# Patient Record
Sex: Female | Born: 1950
Health system: Southern US, Community
[De-identification: ages and names within clinical notes are randomized; demographics above are authoritative.]

## PROBLEM LIST (undated history)

## (undated) ENCOUNTER — Emergency Department (HOSPITAL_BASED_OUTPATIENT_CLINIC_OR_DEPARTMENT_OTHER): Discharge status: Absent without leave | Payer: BLUE CROSS/BLUE SHIELD

## (undated) DIAGNOSIS — E079 Disorder of thyroid, unspecified: Secondary | ICD-10-CM

## (undated) DIAGNOSIS — E7800 Pure hypercholesterolemia, unspecified: Secondary | ICD-10-CM

## (undated) DIAGNOSIS — E119 Type 2 diabetes mellitus without complications: Secondary | ICD-10-CM

## (undated) DIAGNOSIS — I10 Essential (primary) hypertension: Secondary | ICD-10-CM

## (undated) DIAGNOSIS — M109 Gout, unspecified: Secondary | ICD-10-CM

## (undated) HISTORY — PX: ABDOMINAL HYSTERECTOMY: SHX81

## (undated) HISTORY — PX: TONSILLECTOMY: SUR1361

---

## 2009-02-13 ENCOUNTER — Emergency Department (HOSPITAL_BASED_OUTPATIENT_CLINIC_OR_DEPARTMENT_OTHER): Admission: EM | Admit: 2009-02-13 | Discharge: 2009-02-13 | Payer: Self-pay | Admitting: Emergency Medicine

## 2009-02-13 ENCOUNTER — Ambulatory Visit: Payer: Self-pay | Admitting: Radiology

## 2009-02-15 ENCOUNTER — Emergency Department (HOSPITAL_BASED_OUTPATIENT_CLINIC_OR_DEPARTMENT_OTHER): Admission: EM | Admit: 2009-02-15 | Discharge: 2009-02-15 | Payer: Self-pay | Admitting: Emergency Medicine

## 2012-08-16 ENCOUNTER — Emergency Department (HOSPITAL_BASED_OUTPATIENT_CLINIC_OR_DEPARTMENT_OTHER)
Admission: EM | Admit: 2012-08-16 | Discharge: 2012-08-16 | Disposition: A | Discharge status: Home / Self Care | Payer: Managed Care, Other (non HMO) | Attending: Emergency Medicine | Admitting: Emergency Medicine

## 2012-08-16 ENCOUNTER — Encounter (HOSPITAL_BASED_OUTPATIENT_CLINIC_OR_DEPARTMENT_OTHER): Payer: Self-pay | Admitting: Family Medicine

## 2012-08-16 DIAGNOSIS — G8929 Other chronic pain: Secondary | ICD-10-CM | POA: Insufficient documentation

## 2012-08-16 DIAGNOSIS — R269 Unspecified abnormalities of gait and mobility: Secondary | ICD-10-CM | POA: Insufficient documentation

## 2012-08-16 DIAGNOSIS — M1990 Unspecified osteoarthritis, unspecified site: Secondary | ICD-10-CM

## 2012-08-16 DIAGNOSIS — M2550 Pain in unspecified joint: Secondary | ICD-10-CM | POA: Insufficient documentation

## 2012-08-16 DIAGNOSIS — M545 Low back pain, unspecified: Secondary | ICD-10-CM | POA: Insufficient documentation

## 2012-08-16 DIAGNOSIS — M79609 Pain in unspecified limb: Secondary | ICD-10-CM | POA: Insufficient documentation

## 2012-08-16 DIAGNOSIS — M549 Dorsalgia, unspecified: Secondary | ICD-10-CM

## 2012-08-16 DIAGNOSIS — M129 Arthropathy, unspecified: Secondary | ICD-10-CM | POA: Insufficient documentation

## 2012-08-16 DIAGNOSIS — R209 Unspecified disturbances of skin sensation: Secondary | ICD-10-CM | POA: Insufficient documentation

## 2012-08-16 HISTORY — DX: Gout, unspecified: M10.9

## 2012-08-16 HISTORY — DX: Essential (primary) hypertension: I10

## 2012-08-16 HISTORY — DX: Pure hypercholesterolemia, unspecified: E78.00

## 2012-08-16 HISTORY — DX: Type 2 diabetes mellitus without complications: E11.9

## 2012-08-16 HISTORY — DX: Disorder of thyroid, unspecified: E07.9

## 2012-08-16 LAB — CBC WITH DIFFERENTIAL/PLATELET
Basophils Absolute: 0 10*3/uL (ref 0.0–0.1)
Basophils Relative: 0 % (ref 0–1)
Eosinophils Relative: 1 % (ref 0–5)
Hemoglobin: 13.2 g/dL (ref 12.0–15.0)
Lymphs Abs: 1.8 10*3/uL (ref 0.7–4.0)
MCH: 29.8 pg (ref 26.0–34.0)
MCV: 87.6 fL (ref 78.0–100.0)
Monocytes Absolute: 0.6 10*3/uL (ref 0.1–1.0)
Monocytes Relative: 11 % (ref 3–12)
Neutrophils Relative %: 54 % (ref 43–77)
RBC: 4.43 MIL/uL (ref 3.87–5.11)
WBC: 5.2 10*3/uL (ref 4.0–10.5)

## 2012-08-16 LAB — BASIC METABOLIC PANEL
BUN: 16 mg/dL (ref 6–23)
Creatinine, Ser: 0.8 mg/dL (ref 0.50–1.10)
Glucose, Bld: 109 mg/dL — ABNORMAL HIGH (ref 70–99)
Potassium: 4 mEq/L (ref 3.5–5.1)
Sodium: 141 mEq/L (ref 135–145)

## 2012-08-16 LAB — TROPONIN I: Troponin I: <0.3 ng/mL (ref <0.30)

## 2012-08-16 MED ORDER — PROMETHAZINE HCL 25 MG PO TABS: 25.0000 mg | ORAL_TABLET | Freq: Four times a day (QID) | ORAL | Status: AC | PRN

## 2012-08-16 MED ORDER — PREDNISONE 20 MG PO TABS: ORAL_TABLET | ORAL | Status: AC

## 2012-08-16 MED ORDER — HYDROCODONE-ACETAMINOPHEN 5-325 MG PO TABS: 2.0000 | ORAL_TABLET | Freq: Four times a day (QID) | ORAL | Status: DC | PRN

## 2012-08-16 MED ORDER — ACETAMINOPHEN 500 MG PO TABS
1000.0000 mg | ORAL_TABLET | Freq: Once | ORAL | Status: AC
  Administered 2012-08-16: 1000 mg via ORAL
  Filled 2012-08-16: qty 2

## 2012-08-16 NOTE — ED Provider Notes (Signed)
Medical screening examination/treatment/procedure(s) were performed by non-physician practitioner and as supervising physician I was immediately available for consultation/collaboration.  Glynn Octave, MD 08/16/12 [phone]

## 2012-08-16 NOTE — ED Notes (Signed)
MD at bedside. 

## 2012-08-16 NOTE — ED Notes (Signed)
Pt c/o right low back pain shooting down right leg. Pt sts she has h/o same pain for years and had XR last year for same and told it was "arthritis".

## 2012-08-16 NOTE — ED Provider Notes (Signed)
History    CSN: [id number] Arrival date & time 08/16/12  4098  First MD Initiated Contact with Patient 08/16/12 [phone]     Chief Complaint  Patient presents with  . Back Pain   (Consider location/radiation/quality/duration/timing/severity/associated sxs/prior Treatment) HPI Comments: Patient is a 62 year old female with history of gout and arthritis who presents today with low back pain. She has had this pain chronically for years, worse in the past 3 weeks. It is a sharp pain that radiates up her back and down her right leg. Pain is worse with walking. She does not remember straining her back, but states she does a lot of housework. She has been told that this pain is arthritis, but she does not believe arthritis would last this long and feels as though she should get checked. She also notes right arm pain and numbness that began last night. It starts in her chest and back and radiates into her arm. She does not have a headache and has never had these symptoms before. No fevers, chills, nausea, vomiting, diaphoresis, bowel or bladder incontinence, hx of CA, IVDA.   The history is provided by the patient. No language interpreter was used.   No past medical history on file. No past surgical history on file. No family history on file. History  Substance Use Topics  . Smoking status: Not on file  . Smokeless tobacco: Not on file  . Alcohol Use: Not on file   OB History   No data available     Review of Systems  Constitutional: Negative for fever and chills.  Gastrointestinal: Negative for nausea, vomiting and abdominal pain.  Musculoskeletal: Positive for back pain, arthralgias and gait problem.  Neurological: Positive for numbness.  All other systems reviewed and are negative.    Allergies  Review of patient's allergies indicates not on file.  Home Medications  No current outpatient prescriptions on file. BP 153/96  Pulse 100  Temp(Src) 98.6 F (37 C) (Oral)  Resp 20   SpO2 97% Physical Exam  Nursing note and vitals reviewed. Constitutional: She is oriented to person, place, and time. She appears well-developed and well-nourished. No distress.  HENT:  Head: Normocephalic and atraumatic.  Right Ear: External ear normal.  Left Ear: External ear normal.  Nose: Nose normal.  Mouth/Throat: Oropharynx is clear and moist.  Eyes: Conjunctivae are normal.  Neck: Normal range of motion.  Cardiovascular: Normal rate, regular rhythm, normal heart sounds, intact distal pulses and normal pulses.   Pulmonary/Chest: Effort normal and breath sounds normal. No stridor. No respiratory distress. She has no wheezes. She has no rales.  Abdominal: Soft. She exhibits no distension.  Musculoskeletal: Normal range of motion.       Lumbar back: She exhibits tenderness. She exhibits no bony tenderness.       Back:  Neurological: She is alert and oriented to person, place, and time. She has normal strength. No sensory deficit. Gait (antalgic ) abnormal. Coordination normal.  Skin: Skin is warm and dry. She is not diaphoretic. No erythema.  Psychiatric: She has a normal mood and affect. Her behavior is normal.    ED Course  Procedures (including critical care time)   Date: 08/16/2012  Rate: 70  Rhythm: normal sinus rhythm  QRS Axis: normal  Intervals: PR prolonged  ST/T Wave abnormalities: normal  Conduction Disutrbances:first-degree A-V block   Narrative Interpretation:   Old EKG Reviewed: none available    Labs Reviewed  BASIC METABOLIC PANEL - Abnormal;  Notable for the following:    Glucose, Bld 109 (*)    Calcium 10.7 (*)    GFR calc non Af Amer 78 (*)    All other components within normal limits  TROPONIN I  CBC WITH DIFFERENTIAL   No results found. 1. Back pain   2. Arthritis     MDM  Patient with back pain.  No neurological deficits and normal neuro exam. Patient can walk but states is painful.  No loss of bowel or bladder control.  No concern for  cauda equina.  No fever, night sweats, weight loss, h/o cancer, IVDU.  RICE protocol and pain medicine indicated and discussed with patient. No concern for cardiac etiology of arm and back pain.       Mora Bellman, [name] 08/16/12 1051

## 2014-06-05 ENCOUNTER — Encounter (HOSPITAL_BASED_OUTPATIENT_CLINIC_OR_DEPARTMENT_OTHER): Payer: Self-pay

## 2014-06-05 ENCOUNTER — Emergency Department (HOSPITAL_BASED_OUTPATIENT_CLINIC_OR_DEPARTMENT_OTHER)
Admission: EM | Admit: 2014-06-05 | Discharge: 2014-06-05 | Disposition: A | Discharge status: Home / Self Care | Payer: Managed Care, Other (non HMO) | Attending: Emergency Medicine | Admitting: Emergency Medicine

## 2014-06-05 DIAGNOSIS — M79674 Pain in right toe(s): Secondary | ICD-10-CM | POA: Diagnosis present

## 2014-06-05 DIAGNOSIS — Z7952 Long term (current) use of systemic steroids: Secondary | ICD-10-CM | POA: Insufficient documentation

## 2014-06-05 DIAGNOSIS — Z79899 Other long term (current) drug therapy: Secondary | ICD-10-CM | POA: Diagnosis not present

## 2014-06-05 DIAGNOSIS — E780 Pure hypercholesterolemia: Secondary | ICD-10-CM | POA: Diagnosis not present

## 2014-06-05 DIAGNOSIS — M21611 Bunion of right foot: Secondary | ICD-10-CM

## 2014-06-05 DIAGNOSIS — M2011 Hallux valgus (acquired), right foot: Secondary | ICD-10-CM | POA: Diagnosis not present

## 2014-06-05 DIAGNOSIS — E079 Disorder of thyroid, unspecified: Secondary | ICD-10-CM | POA: Diagnosis not present

## 2014-06-05 DIAGNOSIS — E119 Type 2 diabetes mellitus without complications: Secondary | ICD-10-CM | POA: Insufficient documentation

## 2014-06-05 DIAGNOSIS — M109 Gout, unspecified: Secondary | ICD-10-CM | POA: Insufficient documentation

## 2014-06-05 DIAGNOSIS — M722 Plantar fascial fibromatosis: Secondary | ICD-10-CM | POA: Diagnosis not present

## 2014-06-05 DIAGNOSIS — I10 Essential (primary) hypertension: Secondary | ICD-10-CM | POA: Diagnosis not present

## 2014-06-05 DIAGNOSIS — M7662 Achilles tendinitis, left leg: Secondary | ICD-10-CM | POA: Diagnosis not present

## 2014-06-05 MED ORDER — IBUPROFEN 800 MG PO TABS: 800.0000 mg | ORAL_TABLET | Freq: Three times a day (TID) | ORAL | Status: AC | PRN

## 2014-06-05 MED ORDER — HYDROCODONE-ACETAMINOPHEN 5-325 MG PO TABS
1.0000 | ORAL_TABLET | ORAL | Status: AC | PRN
Start: 2014-06-05 — End: ?

## 2014-06-05 MED ORDER — IBUPROFEN 800 MG PO TABS
800.0000 mg | ORAL_TABLET | Freq: Once | ORAL | Status: AC
  Administered 2014-06-05: 800 mg via ORAL
  Filled 2014-06-05: qty 1

## 2014-06-05 MED ORDER — HYDROCODONE-ACETAMINOPHEN 5-325 MG PO TABS
1.0000 | ORAL_TABLET | Freq: Once | ORAL | Status: AC
  Administered 2014-06-05: 1 via ORAL
  Filled 2014-06-05: qty 1

## 2014-06-05 NOTE — ED Provider Notes (Signed)
TIME SEEN: 12:20 PM  CHIEF COMPLAINT: Right great toe pain, left heel pain  HPI: Pt is a 64 y.o. female with history of hypertension, diabetes, hyperlipidemia, hypothyroidism, gout who presents emergency department with complaints of right medial great toe pain for the past several months. No history of injury. No swelling, erythema or warmth. Also complaining of waking up with left medial heel pain and tenderness over the Achilles tendon. States pain was worse with trying to get out of bed and worse with dorsiflexion. No known injury. Also no erythema or warmth to this foot or swelling. No numbness, tingling or focal weakness. Patient denies any fever.  ROS: See HPI Constitutional: no fever  Eyes: no drainage  ENT: no runny nose   Cardiovascular:  no chest pain  Resp: no SOB  GI: no vomiting GU: no dysuria Integumentary: no rash  Allergy: no hives  Musculoskeletal: no leg swelling  Neurological: no slurred speech ROS otherwise negative  PAST MEDICAL HISTORY/PAST SURGICAL HISTORY:  Past Medical History  Diagnosis Date  . Hypertension   . Diabetes mellitus without complication   . Thyroid disease   . Gout   . High cholesterol     MEDICATIONS:  Prior to Admission medications   Medication Sig Start Date End Date Taking? Authorizing Provider  Atorvastatin Calcium (LIPITOR PO) Take by mouth.    Historical Provider, MD  colchicine (COLCRYS) 0.6 MG tablet Take 0.6 mg by mouth daily.    Historical Provider, MD  HYDROcodone-acetaminophen (NORCO/VICODIN) 5-325 MG per tablet Take 2 tablets by mouth every 6 (six) hours as needed for pain. 08/16/12   Cleatrice Burke, [name]  levothyroxine (SYNTHROID, LEVOTHROID) 50 MCG tablet Take 50 mcg by mouth daily before breakfast.    Historical Provider, MD  lisinopril-hydrochlorothiazide (PRINZIDE,ZESTORETIC) 20-25 MG per tablet Take 1 tablet by mouth daily.    Historical Provider, MD  metFORMIN (GLUCOPHAGE) 500 MG tablet Take 500 mg by mouth daily with  breakfast.    Historical Provider, MD  predniSONE (DELTASONE) 20 MG tablet 3 tabs po day one, then 2 po daily x 4 days 08/16/12   Cleatrice Burke, [name]  promethazine (PHENERGAN) 25 MG tablet Take 1 tablet (25 mg total) by mouth every 6 (six) hours as needed for nausea. 08/16/12   Cleatrice Burke, [name]  SIMVASTATIN PO Take by mouth.    Historical Provider, MD    ALLERGIES:  No Known Allergies  SOCIAL HISTORY:  History  Substance Use Topics  . Smoking status: Never Smoker   . Smokeless tobacco: Not on file  . Alcohol Use: No    FAMILY HISTORY: No family history on file.  EXAM: BP 140/74 mmHg  Pulse 75  Temp(Src) 97.6 F (36.4 C) (Oral)  Resp 16  Ht 5' (1.524 m)  Wt 185 lb (83.915 kg)  BMI 36.13 kg/m2  SpO2 100% CONSTITUTIONAL: Alert and oriented and responds appropriately to questions. Well-appearing; well-nourished HEAD: Normocephalic EYES: Conjunctivae clear, PERRL ENT: normal nose; no rhinorrhea; moist mucous membranes; pharynx without lesions noted NECK: Supple, no meningismus, no LAD  CARD: RRR; S1 and S2 appreciated; no murmurs, no clicks, no rubs, no gallops RESP: Normal chest excursion without splinting or tachypnea; breath sounds clear and equal bilaterally; no wheezes, no rhonchi, no rales, no hypoxia or respiratory distress, speaking full sentences ABD/GI: Normal bowel sounds; non-distended; soft, non-tender, no rebound, no guarding, no peritoneal signs BACK:  The back appears normal and is non-tender to palpation, there is no CVA tenderness EXT: Patient is tender to  palpation over the medial aspect of the right great toe and has a bony prominence consistent with bunion, she is also tender to palpation over the medial aspect of the plantar surface of the left foot where the plantar fascia inserts without ecchymosis or warmth or erythema, tender palpation over the Achilles tendon on the left side without associated erythema or warmth, pain with dorsiflexion but Normal ROM  in all joints; otherwise extremities are non-tender to palpation; no edema; normal capillary refill; no cyanosis, no calf tenderness or swelling, normal sensation diffusely, 2+ DP pulses bilaterally    SKIN: Normal color for age and race; warm NEURO: Moves all extremities equally, sensation to light touch intact diffusely, cranial nerves II through XII intact PSYCH: The patient's mood and manner are appropriate. Grooming and personal hygiene are appropriate.  MEDICAL DECISION MAKING: Patient here with complaints of pain in her right foot and left foot. Right foot has a hallux valgus deformity. Left foot pain likely caused by plantar fasciitis and Achilles tendinitis. We'll provide her with crutches. No sign of gout or septic arthritis or cellulitis on exam. No history of any injury to suggest that x-rays are indicated. We'll discharge with ibuprofen and Vicodin. Have instructed her to follow-up with a podiatrist for her bunion and sports medicine as needed for her plantar fasciitis and Achilles tendinitis symptoms do not improve. Discussed return precautions. She verbalized understanding and is comfortable with plan.      [place], DO 06/05/14 1333

## 2014-06-05 NOTE — ED Notes (Signed)
Pt reports pain to left foot/ankle since yesterday. Also sts pain to right great toe. Denies injury

## 2014-06-05 NOTE — Discharge Instructions (Signed)
Achilles Tendinitis Achilles tendinitis is inflammation of the tough, cord-like band that attaches the lower muscles of your leg to your heel (Achilles tendon). It is usually caused by overusing the tendon and joint involved.  CAUSES Achilles tendinitis can happen because of:  A sudden increase in exercise or activity (such as running).  Doing the same exercises or activities (such as jumping) over and over.  Not warming up calf muscles before exercising.  Exercising in shoes that are worn out or not made for exercise.  Having arthritis or a bone growth on the back of the heel bone. This can rub against the tendon and hurt the tendon. SIGNS AND SYMPTOMS The most common symptoms are:  Pain in the back of the leg, just above the heel. The pain usually gets worse with exercise and better with rest.  Stiffness or soreness in the back of the leg, especially in the morning.  Swelling of the skin over the Achilles tendon.  Trouble standing on tiptoe. Sometimes, an Achilles tendon tears (ruptures). Symptoms of an Achilles tendon rupture can include:  Sudden, severe pain in the back of the leg.  Trouble putting weight on the foot or walking normally. DIAGNOSIS Achilles tendinitis will be diagnosed based on symptoms and a physical examination. An X-ray may be done to check if another condition is causing your symptoms. An MRI may be ordered if your health care provider suspects you may have completely torn your tendon, which is called an Achilles tendon rupture.  TREATMENT  Achilles tendinitis usually gets better over time. It can take weeks to months to heal completely. Treatment focuses on treating the symptoms and helping the injury heal. HOME CARE INSTRUCTIONS   Rest your Achilles tendon and avoid activities that cause pain.  Apply ice to the injured area:  Put ice in a plastic bag.  Place a towel between your skin and the bag.  Leave the ice on for 20 minutes, 2-3 times a  day  Try to avoid using the tendon (other than gentle range of motion) while the tendon is painful. Do not resume use until instructed by your health care provider. Then begin use gradually. Do not increase use to the point of pain. If pain does develop, decrease use and continue the above measures. Gradually increase activities that do not cause discomfort until you achieve normal use.  Do exercises to make your calf muscles stronger and more flexible. Your health care provider or physical therapist can recommend exercises for you to do.  Wrap your ankle with an elastic bandage or other wrap. This can help keep your tendon from moving too much. Your health care provider will show you how to wrap your ankle correctly.  Only take over-the-counter or prescription medicines for pain, discomfort, or fever as directed by your health care provider. SEEK MEDICAL CARE IF:   Your pain and swelling increase or pain is uncontrolled with medicines.  You develop new, unexplained symptoms or your symptoms get worse.  You are unable to move your toes or foot.  You develop warmth and swelling in your foot.  You have an unexplained temperature. MAKE SURE YOU:   Understand these instructions. Bunion You have a bunion deformity of the feet. This is more common in women. It tends to be an inherited problem. Symptoms can include pain, swelling, and deformity around the great toe. Numbness and tingling may also be present. Your symptoms are often worsened by wearing shoes that cause pressure on the bunion. Changing  the type of shoes you wear helps reduce symptoms. A wide shoe decreases pressure on the bunion. An arch support may be used if you have flat feet. Avoid shoes with heels higher than two inches. This puts more pressure on the bunion. X-rays may be helpful in evaluating the severity of the problem. Other foot problems often seen with bunions include corns, calluses, and hammer toes. If the deformity or  pain is severe, surgical treatment may be necessary. Keep off your painful foot as much as possible until the pain is relieved. Call your caregiver if your symptoms are worse.  SEEK IMMEDIATE MEDICAL CARE IF:  You have increased redness, pain, swelling, or other symptoms of infection. Document Released: 01/13/2005 Document Revised: 04/07/2011 Document Reviewed: 07/13/2006 [facility] Patient Information 2015 [place], [place]. This information is not intended to replace advice given to you by your health care provider. Make sure you discuss any questions you have with your health care provider.  Tendinitis Tendinitis is swelling and inflammation of the tendons. Tendons are band-like tissues that connect muscle to bone. Tendinitis commonly occurs in the:  Shoulders (rotator cuff). Heels (Achilles tendon). Elbows (triceps tendon). CAUSES Tendinitis is usually caused by overusing the tendon, muscles, and joints involved. When the tissue surrounding a tendon (synovium) becomes inflamed, it is called tenosynovitis. Tendinitis commonly develops in people whose jobs require repetitive motions. SYMPTOMS Pain. Tenderness. Mild swelling. DIAGNOSIS Tendinitis is usually diagnosed by physical exam. Your health care provider may also order X-rays or other imaging tests. TREATMENT Your health care provider may recommend certain medicines or exercises for your treatment. HOME CARE INSTRUCTIONS  Use a sling or splint for as long as directed by your health care provider until the pain decreases. Put ice on the injured area. Put ice in a plastic bag. Place a towel between your skin and the bag. Leave the ice on for 15-20 minutes, 3-4 times a day, or as directed by your health care provider. Avoid using the limb while the tendon is painful. Perform gentle range of motion exercises only as directed by your health care provider. Stop exercises if pain or discomfort increase, unless directed otherwise by your health  care provider. Only take over-the-counter or prescription medicines for pain, discomfort, or fever as directed by your health care provider. SEEK MEDICAL CARE IF:  Your pain and swelling increase. You develop new, unexplained symptoms, especially increased numbness in the hands. MAKE SURE YOU:  Understand these instructions. Will watch your condition. Will get help right away if you are not doing well or get worse. Document Released: 01/11/2000 Document Revised: 05/30/2013 Document Reviewed: 04/01/2010 [facility] Patient Information 2015 [place], [place]. This information is not intended to replace advice given to you by your health care provider. Make sure you discuss any questions you have with your health care provider.   Will watch your condition.  Will get help right away if you are not doing well or get worse. Document Released: 10/23/2004 Document Revised: 11/03/2012 Document Reviewed: 08/25/2012 [facility] Patient Information 2015 [place], [place]. This information is not intended to replace advice given to you by your health care provider. Make sure you discuss any questions you have with your health care provider.

## 2014-06-07 ENCOUNTER — Ambulatory Visit (INDEPENDENT_AMBULATORY_CARE_PROVIDER_SITE_OTHER): Payer: Managed Care, Other (non HMO) | Admitting: Podiatry

## 2014-06-07 ENCOUNTER — Encounter: Payer: Self-pay | Admitting: Podiatry

## 2014-06-07 VITALS — BP 123/73 | HR 82 | Ht 61.0 in | Wt 185.0 lb

## 2014-06-07 DIAGNOSIS — S93692A Other sprain of left foot, initial encounter: Secondary | ICD-10-CM

## 2014-06-07 DIAGNOSIS — R262 Difficulty in walking, not elsewhere classified: Secondary | ICD-10-CM | POA: Insufficient documentation

## 2014-06-07 DIAGNOSIS — M79605 Pain in left leg: Secondary | ICD-10-CM

## 2014-06-07 DIAGNOSIS — M669 Spontaneous rupture of unspecified tendon: Secondary | ICD-10-CM | POA: Insufficient documentation

## 2014-06-07 DIAGNOSIS — S96812A Strain of other specified muscles and tendons at ankle and foot level, left foot, initial encounter: Secondary | ICD-10-CM

## 2014-06-07 DIAGNOSIS — M79606 Pain in leg, unspecified: Secondary | ICD-10-CM | POA: Insufficient documentation

## 2014-06-07 NOTE — Progress Notes (Signed)
Subjective: 64 year old presented via wheel chair stating that she has had degenerative disc disease and been getting cortisone injections. Last injection was in December 2015. Total received 5 injections. Last month right great toe joint hurt, which happens occasionally as a form of gout. Then last Sunday (06/04/14) left foot start hurting in back of ankle and could not walk by that afternoon. She went to ER and was told that she had Achilles tendonitis. She is diabetic and blood sugar reading was up high yesterday, in 190's. Diagnosed for diabetic 2 years ago.  Been taking Ibuprofen and Hydrocodone. She is not able to take any step with the affected left foot.  Cholesterol, Thyroid, diabetes, hypertension, back pain x many years. Review of Systems - General ROS: negative Ophthalmic ROS: negative Allergy and Immunology ROS: negative Hematological and Lymphatic ROS: negative Endocrine ROS: Been treated for Thyroid conditions. Breast ROS: negative for breast lumps Respiratory ROS: no cough, shortness of breath, or wheezing Cardiovascular ROS: Been treated for Hypertension. Gastrointestinal ROS: Diabetic. Genito-Urinary ROS: no dysuria, trouble voiding, or hematuria Musculoskeletal ROS: DJD on lower back for many years. Neurological ROS: Lower back pain. Dermatological ROS: negative.  Objective: Neurovascular status are within normal. Mild edema medial and lateral aspect of the Achilles tendon insertion site with severe pain upon ankle joint range of motion left foot. No erythema or increase in temperature noted. Orthopedic: Weak and unable to perform active dorsiflexion of the ankle joint left.  No change in Achilles tendon structural integrity with palpation. No indentation or irregularity noted along the posterior, medial, or lateral borders of achilles tendon itself.  Positive of forefoot varus with bunion bilateral. Unable to bear weight or ambulate using left lower limb. Radiographic  examination show no acute changes in osseous structure on the back of ankle area left foot.  Assessment: Possible Plantaris tendon rupture left ankle. Pain and swelling posterior ankle, medial and lateral to achilles tendon. Difficulty walking using left lower limb.  Plan: Reviewed findings and available options. Left lower limb placed in Pneumatic CAM walker. Advised to stay off of feet for the next 2 weeks. Continue medication as prescribed at ER.  Return in one week.

## 2014-06-07 NOTE — Patient Instructions (Signed)
Seen for pain in left ankle pain. Possible "Ruptured Plantaris Tendon" left lower limb. Placed in [employer]. If pain continues, will replaced it with Cast. Do none weight bearing on left lower limb for the next 2 weeks.  Should be ok using a walker.  Return in one week for follow up.

## 2014-06-14 ENCOUNTER — Encounter: Payer: Self-pay | Admitting: Podiatry

## 2014-06-14 ENCOUNTER — Ambulatory Visit (INDEPENDENT_AMBULATORY_CARE_PROVIDER_SITE_OTHER): Payer: Managed Care, Other (non HMO) | Admitting: Podiatry

## 2014-06-14 VITALS — BP 170/80 | HR 77

## 2014-06-14 DIAGNOSIS — S93692D Other sprain of left foot, subsequent encounter: Secondary | ICD-10-CM

## 2014-06-14 DIAGNOSIS — M79672 Pain in left foot: Secondary | ICD-10-CM | POA: Diagnosis not present

## 2014-06-14 DIAGNOSIS — M10072 Idiopathic gout, left ankle and foot: Secondary | ICD-10-CM | POA: Diagnosis not present

## 2014-06-14 DIAGNOSIS — R609 Edema, unspecified: Secondary | ICD-10-CM | POA: Diagnosis not present

## 2014-06-14 DIAGNOSIS — R600 Localized edema: Secondary | ICD-10-CM

## 2014-06-14 DIAGNOSIS — S96812D Strain of other specified muscles and tendons at ankle and foot level, left foot, subsequent encounter: Secondary | ICD-10-CM

## 2014-06-14 DIAGNOSIS — M109 Gout, unspecified: Secondary | ICD-10-CM | POA: Insufficient documentation

## 2014-06-14 NOTE — Patient Instructions (Signed)
Follow up on injured Plantaris tendon left. Doing well with CAM walker. Developed acute gout on left. Cortisone injection given. Return in 3 weeks.

## 2014-06-14 NOTE — Progress Notes (Signed)
Subjective: 1 week follow up on Plantaris tendon rupture left.  Stated that she is having severe pain and swelling on the left foot big joint area for a few days. She is able to get around ok with minimum discomfort on ankle while walking in pneumatic CAM walker, but her forefoot is swollen and painful. Stated that she does have a history of gout. She stopped taking Colchicine while she was on Ibuprofen.   Objective: Red swollen first MPJ with severe pain when pressed or moved the joint. Minimum edema at the back of heel.   Assessment: 2 weeks post Tendon injury, Plantaris left. Acute Gout left big joint with pain and swelling.  Plan: Reviewed findings. Unna boot. Cortisone injection given to the first MPJ left. Injection consisted of a mixture of 4 mg Dexamethasone, 4 mg Triamcinolone, and 1 cc of 0.5% Marcaine plain.  Patient tolerated well without difficulty.  Patient is to go back to her Colchicine and leave off Ibuprofen.  Plan to be out of work for 6-8 week. Return in 3 weeks to evaluate if she would be ready to return to work.

## 2014-07-12 ENCOUNTER — Ambulatory Visit (INDEPENDENT_AMBULATORY_CARE_PROVIDER_SITE_OTHER): Payer: Managed Care, Other (non HMO) | Admitting: Podiatry

## 2014-07-12 ENCOUNTER — Encounter: Payer: Self-pay | Admitting: Podiatry

## 2014-07-12 VITALS — BP 152/78 | HR 76

## 2014-07-12 DIAGNOSIS — R262 Difficulty in walking, not elsewhere classified: Secondary | ICD-10-CM | POA: Diagnosis not present

## 2014-07-12 DIAGNOSIS — M669 Spontaneous rupture of unspecified tendon: Secondary | ICD-10-CM

## 2014-07-12 NOTE — Patient Instructions (Signed)
Seen for follow up on left foot tendon injury. Doing well with injection, wrapping and CAM walker.  Left lower limb placed in [place] and CAM walker.  Tentatively return to work date is set for August 07, 2014.  Return on July 6 to finalize release.

## 2014-07-12 NOTE — Progress Notes (Signed)
Subjective: 5 weeks follow up on Plantaris tendon rupture left.  Soft cast and CAM walker is helping. Took 800 mg Motrin that is helping her gout pain.   Objective: Resolved first MPJ pain after injection. Minimum edema at the back of heel.  Able to ambulate ok with Pneumatic CAM walker.   Assessment: 5 weeks post Tendon injury, Plantaris left. Resolved acute gout left big joint.  Plan: Reviewed findings. Unna boot applied. Return in 3 weeks to evaluate if she would be ready to return to work.

## 2014-08-02 ENCOUNTER — Ambulatory Visit: Payer: Managed Care, Other (non HMO) | Admitting: Podiatry

## 2014-12-20 ENCOUNTER — Encounter (HOSPITAL_BASED_OUTPATIENT_CLINIC_OR_DEPARTMENT_OTHER): Payer: Self-pay

## 2014-12-20 ENCOUNTER — Emergency Department (HOSPITAL_BASED_OUTPATIENT_CLINIC_OR_DEPARTMENT_OTHER): Payer: 59

## 2014-12-20 ENCOUNTER — Emergency Department (HOSPITAL_BASED_OUTPATIENT_CLINIC_OR_DEPARTMENT_OTHER)
Admission: EM | Admit: 2014-12-20 | Discharge: 2014-12-20 | Disposition: A | Discharge status: Home / Self Care | Payer: 59 | Attending: Physician Assistant | Admitting: Physician Assistant

## 2014-12-20 DIAGNOSIS — Z79899 Other long term (current) drug therapy: Secondary | ICD-10-CM | POA: Diagnosis not present

## 2014-12-20 DIAGNOSIS — M109 Gout, unspecified: Secondary | ICD-10-CM | POA: Diagnosis not present

## 2014-12-20 DIAGNOSIS — R109 Unspecified abdominal pain: Secondary | ICD-10-CM | POA: Diagnosis not present

## 2014-12-20 DIAGNOSIS — M549 Dorsalgia, unspecified: Secondary | ICD-10-CM | POA: Diagnosis not present

## 2014-12-20 DIAGNOSIS — Z90710 Acquired absence of both cervix and uterus: Secondary | ICD-10-CM | POA: Insufficient documentation

## 2014-12-20 DIAGNOSIS — E119 Type 2 diabetes mellitus without complications: Secondary | ICD-10-CM | POA: Diagnosis not present

## 2014-12-20 DIAGNOSIS — Z87440 Personal history of urinary (tract) infections: Secondary | ICD-10-CM | POA: Diagnosis not present

## 2014-12-20 DIAGNOSIS — Z7984 Long term (current) use of oral hypoglycemic drugs: Secondary | ICD-10-CM | POA: Diagnosis not present

## 2014-12-20 DIAGNOSIS — E7800 Pure hypercholesterolemia, unspecified: Secondary | ICD-10-CM | POA: Diagnosis not present

## 2014-12-20 DIAGNOSIS — G8929 Other chronic pain: Secondary | ICD-10-CM | POA: Diagnosis not present

## 2014-12-20 DIAGNOSIS — I10 Essential (primary) hypertension: Secondary | ICD-10-CM | POA: Insufficient documentation

## 2014-12-20 DIAGNOSIS — E079 Disorder of thyroid, unspecified: Secondary | ICD-10-CM | POA: Diagnosis not present

## 2014-12-20 DIAGNOSIS — Z7952 Long term (current) use of systemic steroids: Secondary | ICD-10-CM | POA: Diagnosis not present

## 2014-12-20 DIAGNOSIS — Z792 Long term (current) use of antibiotics: Secondary | ICD-10-CM | POA: Insufficient documentation

## 2014-12-20 DIAGNOSIS — R319 Hematuria, unspecified: Secondary | ICD-10-CM | POA: Diagnosis present

## 2014-12-20 LAB — URINALYSIS, ROUTINE W REFLEX MICROSCOPIC
BILIRUBIN URINE: NEGATIVE
GLUCOSE, UA: NEGATIVE mg/dL
KETONES UR: NEGATIVE mg/dL
Leukocytes, UA: NEGATIVE
Nitrite: NEGATIVE
PH: 6 (ref 5.0–8.0)
Protein, ur: 100 mg/dL — AB
SPECIFIC GRAVITY, URINE: 1.018 (ref 1.005–1.030)

## 2014-12-20 LAB — URINE MICROSCOPIC-ADD ON

## 2014-12-20 MED ORDER — CEPHALEXIN 500 MG PO CAPS: 500.0000 mg | ORAL_CAPSULE | Freq: Two times a day (BID) | ORAL | Status: AC

## 2014-12-20 MED ORDER — CEPHALEXIN 250 MG PO CAPS
500.0000 mg | ORAL_CAPSULE | Freq: Once | ORAL | Status: AC
  Administered 2014-12-20: 500 mg via ORAL
  Filled 2014-12-20: qty 2

## 2014-12-20 NOTE — ED Notes (Signed)
Reports blood in urine that started on Friday.  Reports right lower back pain.  Also complains of pressure in pubic region.

## 2014-12-20 NOTE — Discharge Instructions (Signed)
Please take antibiotics as prescribed. Additionally you will need to follow up with your primary care physician or urologist to make sure that the hematuria (blood in your urine) has gone away.

## 2014-12-20 NOTE — ED Provider Notes (Signed)
CSN: [id number]     Arrival date & time 12/20/14  1200 History   First MD Initiated Contact with Patient 12/20/14 1218     Chief Complaint  Patient presents with  . Hematuria     (Consider location/radiation/quality/duration/timing/severity/associated sxs/prior Treatment) HPI   Patient is a very pleasant 64 year old female with history of hypertension diabetes and high cholesterol presenting today with blood in her urine. Patient noted she had blood in her urine for the last couple days. She reports she has some pain in her right back as well. Has not had any fevers. No dysuria. No history of kidney stones.  Past Medical History  Diagnosis Date  . Hypertension   . Diabetes mellitus without complication ([place])   . Thyroid disease   . Gout   . High cholesterol    Past Surgical History  Procedure Laterality Date  . Tonsillectomy    . Abdominal hysterectomy     No family history on file. Social History  Substance Use Topics  . Smoking status: Never Smoker   . Smokeless tobacco: Never Used  . Alcohol Use: No   OB History    No data available     Review of Systems  Constitutional: Negative for activity change.  Respiratory: Negative for shortness of breath.   Cardiovascular: Negative for chest pain.  Gastrointestinal: Negative for abdominal pain.  Genitourinary: Positive for hematuria and flank pain. Negative for dysuria, vaginal bleeding and pelvic pain.      Allergies  Review of patient's allergies indicates no known allergies.  Home Medications   Prior to Admission medications   Medication Sig Start Date End Date Taking? Authorizing Provider  atorvastatin (LIPITOR) 40 MG tablet  03/14/14  Yes Historical Provider, MD  colchicine (COLCRYS) 0.6 MG tablet Take 0.6 mg by mouth daily.   Yes Historical Provider, MD  levothyroxine (SYNTHROID, LEVOTHROID) 50 MCG tablet Take 50 mcg by mouth daily before breakfast.   Yes Historical Provider, MD   lisinopril-hydrochlorothiazide (PRINZIDE,ZESTORETIC) 20-25 MG per tablet Take 1 tablet by mouth daily.   Yes Historical Provider, MD  metFORMIN (GLUCOPHAGE) 500 MG tablet Take 500 mg by mouth daily with breakfast.   Yes Historical Provider, MD  SIMVASTATIN PO Take by mouth.   Yes Historical Provider, MD  azithromycin (ZITHROMAX) 250 MG tablet  03/15/14   Historical Provider, MD  HYDROcodone-acetaminophen (NORCO/VICODIN) 5-325 MG per tablet Take 1 tablet by mouth every 4 (four) hours as needed. 06/05/14   Kristen N [name], DO  ibuprofen (ADVIL,MOTRIN) 800 MG tablet Take 1 tablet (800 mg total) by mouth every 8 (eight) hours as needed for mild pain. 06/05/14   Kristen N [name], DO  predniSONE (DELTASONE) 20 MG tablet 3 tabs po day one, then 2 po daily x 4 days 08/16/12   Cleatrice Burke, [name]  promethazine (PHENERGAN) 25 MG tablet Take 1 tablet (25 mg total) by mouth every 6 (six) hours as needed for nausea. 08/16/12   Cleatrice Burke, [name]   BP 159/75 mmHg  Pulse 97  Temp(Src) 97.6 F (36.4 C) (Oral)  Resp 20  Ht 5' (1.524 m)  Wt 190 lb (86.183 kg)  BMI 37.11 kg/m2  SpO2 99% Physical Exam  Constitutional: She is oriented to person, place, and time. She appears well-developed and well-nourished.  HENT:  Head: Normocephalic and atraumatic.  Eyes: Conjunctivae are normal. Right eye exhibits no discharge.  Neck: Neck supple.  Cardiovascular: Normal rate, regular rhythm and normal heart sounds.   No murmur heard. Pulmonary/Chest: Effort  normal and breath sounds normal. She has no wheezes. She has no rales.  Abdominal: Soft. She exhibits no distension. There is no tenderness.  Musculoskeletal: Normal range of motion. She exhibits no edema.  Neurological: She is oriented to person, place, and time. No cranial nerve deficit.  Skin: Skin is warm and dry. No rash noted. She is not diaphoretic.  Psychiatric: She has a normal mood and affect. Her behavior is normal.  Nursing note and vitals reviewed.   ED  Course  Procedures (including critical care time) Labs Review Labs Reviewed  URINALYSIS, ROUTINE W REFLEX MICROSCOPIC (NOT AT [facility]) - Abnormal; Notable for the following:    APPearance CLOUDY (*)    Hgb urine dipstick LARGE (*)    Protein, ur 100 (*)    All other components within normal limits  URINE MICROSCOPIC-ADD ON - Abnormal; Notable for the following:    Squamous Epithelial / LPF 0-5 (*)    Bacteria, UA FEW (*)    All other components within normal limits    Imaging Review Ct Renal Stone Study  12/20/2014  CLINICAL DATA:  Right-sided flank pain and hematuria for 5 days EXAM: CT ABDOMEN AND PELVIS WITHOUT CONTRAST TECHNIQUE: Multidetector CT imaging of the abdomen and pelvis was performed following the standard protocol without IV contrast. COMPARISON:  07/06/2012 FINDINGS: Lung bases are free of acute infiltrate or sizable effusion. The liver, gallbladder, spleen, adrenal glands and pancreas are all normal in their CT appearance. Kidneys are well visualized and reveal no evidence of renal calculi or obstructive change. The ureters are within normal limits. The bladder is partially distended. No pelvic mass lesion is seen. The uterus has been surgically removed.1 degenerative changes of the lumbar spine are noted. The appendix is not well visualized although no inflammatory changes to suggest appendicitis are seen. IMPRESSION: No evidence of renal calculi or urinary tract obstructive changes. No acute abnormality seen. Electronically Signed   By: Inez Catalina M.D.   On: 12/20/2014 13:09   I have personally reviewed and evaluated these images and lab results as part of my medical decision-making.   EKG Interpretation None      MDM   Final diagnoses:  Right flank pain    Patient is a very pleasant to 64 year old female presenting with hypertension diabetes and high cholesterol presenting with hematuria. Patient has painless hematuria. She does report she has some pain in the  right-side of back. However she has chronic pain in this area. Today we're concerned about a stone versus UTI. Her urine shows hemoglobin her urine however no evidence of infection. She does however have dysuria. She was treated for a urinary tract infection with hematuria in 2012. Her CT scan shows no evidence of stone.  We will treat for urinary tract infection today given her past infection with hematuria. We will give her follow-up for urology however and told her to follow-up next week regardless to make sure that her hematuria has passed.  Courteney Julio Alm, MD 12/20/14 1436

## 2015-08-24 ENCOUNTER — Other Ambulatory Visit: Payer: Self-pay | Admitting: Internal Medicine

## 2015-08-24 DIAGNOSIS — R945 Abnormal results of liver function studies: Secondary | ICD-10-CM

## 2015-08-24 DIAGNOSIS — R7989 Other specified abnormal findings of blood chemistry: Secondary | ICD-10-CM

## 2015-09-04 ENCOUNTER — Ambulatory Visit
Admission: RE | Admit: 2015-09-04 | Discharge: 2015-09-04 | Disposition: A | Discharge status: Home / Self Care | Payer: BLUE CROSS/BLUE SHIELD | Source: Ambulatory Visit | Attending: Internal Medicine | Admitting: Internal Medicine

## 2015-09-04 ENCOUNTER — Other Ambulatory Visit: Payer: Self-pay | Admitting: Internal Medicine

## 2015-09-04 DIAGNOSIS — R945 Abnormal results of liver function studies: Secondary | ICD-10-CM

## 2015-09-04 DIAGNOSIS — R7989 Other specified abnormal findings of blood chemistry: Secondary | ICD-10-CM

## 2016-04-27 ENCOUNTER — Emergency Department (HOSPITAL_BASED_OUTPATIENT_CLINIC_OR_DEPARTMENT_OTHER): Payer: PPO

## 2016-04-27 ENCOUNTER — Emergency Department (HOSPITAL_BASED_OUTPATIENT_CLINIC_OR_DEPARTMENT_OTHER)
Admission: EM | Admit: 2016-04-27 | Discharge: 2016-04-27 | Disposition: A | Discharge status: Home / Self Care | Payer: PPO | Attending: Emergency Medicine | Admitting: Emergency Medicine

## 2016-04-27 ENCOUNTER — Encounter (HOSPITAL_BASED_OUTPATIENT_CLINIC_OR_DEPARTMENT_OTHER): Payer: Self-pay | Admitting: Emergency Medicine

## 2016-04-27 DIAGNOSIS — J069 Acute upper respiratory infection, unspecified: Secondary | ICD-10-CM

## 2016-04-27 DIAGNOSIS — I10 Essential (primary) hypertension: Secondary | ICD-10-CM | POA: Diagnosis not present

## 2016-04-27 DIAGNOSIS — Z7984 Long term (current) use of oral hypoglycemic drugs: Secondary | ICD-10-CM | POA: Insufficient documentation

## 2016-04-27 DIAGNOSIS — E119 Type 2 diabetes mellitus without complications: Secondary | ICD-10-CM | POA: Insufficient documentation

## 2016-04-27 DIAGNOSIS — B9789 Other viral agents as the cause of diseases classified elsewhere: Secondary | ICD-10-CM

## 2016-04-27 DIAGNOSIS — Z79899 Other long term (current) drug therapy: Secondary | ICD-10-CM | POA: Insufficient documentation

## 2016-04-27 DIAGNOSIS — R05 Cough: Secondary | ICD-10-CM | POA: Diagnosis not present

## 2016-04-27 MED ORDER — FEXOFENADINE HCL 60 MG PO TABS: 60.0000 mg | ORAL_TABLET | Freq: Two times a day (BID) | ORAL | 0 refills | Status: AC

## 2016-04-27 MED ORDER — OXYMETAZOLINE HCL 0.05 % NA SOLN
1.0000 | Freq: Once | NASAL | Status: AC
  Administered 2016-04-27: 1 via NASAL
  Filled 2016-04-27: qty 15

## 2016-04-27 MED ORDER — BENZONATATE 100 MG PO CAPS: 100.0000 mg | ORAL_CAPSULE | Freq: Three times a day (TID) | ORAL | 0 refills | Status: DC

## 2016-04-27 MED ORDER — MOMETASONE FUROATE 50 MCG/ACT NA SUSP
2.0000 | Freq: Every day | NASAL | 0 refills | Status: AC
Start: 2016-04-27 — End: ?

## 2016-04-27 MED ORDER — ACETAMINOPHEN 500 MG PO TABS
1000.0000 mg | ORAL_TABLET | Freq: Once | ORAL | Status: AC
  Administered 2016-04-27: 1000 mg via ORAL
  Filled 2016-04-27: qty 2

## 2016-04-27 NOTE — ED Provider Notes (Signed)
[facility] DEPT [facility] Provider Note   CSN: [id number] Arrival date & time: 04/27/16  1915   By signing my name below, I, Soijett Blue, attest that this documentation has been prepared under the direction and in the presence of Leo Grosser, MD. Electronically Signed: Soijett Blue, ED Scribe. 04/27/16. 8:25 PM.  History   Chief Complaint Chief Complaint  Patient presents with  . Cough    HPI Robin Wallace is a 66 y.o. female with a PMHx of HTN, DM, who presents to the Emergency Department complaining of cough onset 1 week ago. Pt reports associated sneezing, rhinorrhea, sore throat, post-nasal drip, chest wall pain due to cough, and abdominal soreness due to cough. Pt has tried mucinex with no relief of her symptoms. She reports that she has a hx of seasonal allergies. Pt has sick contacts of her granddaughter who has similar symptoms. She denies any other symptoms.    The history is provided by the patient. No language interpreter was used.    Past Medical History:  Diagnosis Date  . Diabetes mellitus without complication ([place])   . Gout   . High cholesterol   . Hypertension   . Thyroid disease     Patient Active Problem List   Diagnosis Date Noted  . Gout 06/14/2014  . Ruptured, tendon, nontraumatic 06/07/2014  . Difficulty in walking involving ankle and foot joint 06/07/2014  . Pain in lower limb 06/07/2014    Past Surgical History:  Procedure Laterality Date  . ABDOMINAL HYSTERECTOMY    . TONSILLECTOMY      OB History    No data available       Home Medications    Prior to Admission medications   Medication Sig Start Date End Date Taking? Authorizing Provider  atorvastatin (LIPITOR) 40 MG tablet  03/14/14  Yes Historical Provider, MD  colchicine (COLCRYS) 0.6 MG tablet Take 0.6 mg by mouth daily.   Yes Historical Provider, MD  levothyroxine (SYNTHROID, LEVOTHROID) 50 MCG tablet Take 50 mcg by mouth daily before breakfast.   Yes Historical Provider, MD    lisinopril-hydrochlorothiazide (PRINZIDE,ZESTORETIC) 20-25 MG per tablet Take 1 tablet by mouth daily.   Yes Historical Provider, MD  metFORMIN (GLUCOPHAGE) 500 MG tablet Take 500 mg by mouth daily with breakfast.   Yes Historical Provider, MD  SIMVASTATIN PO Take by mouth.   Yes Historical Provider, MD  azithromycin (ZITHROMAX) 250 MG tablet  03/15/14   Historical Provider, MD  cephALEXin (KEFLEX) 500 MG capsule Take 1 capsule (500 mg total) by mouth 2 (two) times daily. 12/20/14   [name] [name], MD  HYDROcodone-acetaminophen (NORCO/VICODIN) 5-325 MG per tablet Take 1 tablet by mouth every 4 (four) hours as needed. 06/05/14   Kristen N [name], DO  ibuprofen (ADVIL,MOTRIN) 800 MG tablet Take 1 tablet (800 mg total) by mouth every 8 (eight) hours as needed for mild pain. 06/05/14   Kristen N [name], DO  predniSONE (DELTASONE) 20 MG tablet 3 tabs po day one, then 2 po daily x 4 days 08/16/12   Cleatrice Burke, [name]  promethazine (PHENERGAN) 25 MG tablet Take 1 tablet (25 mg total) by mouth every 6 (six) hours as needed for nausea. 08/16/12   Cleatrice Burke, [name]    Family History No family history on file.  Social History Social History  Substance Use Topics  . Smoking status: Never Smoker  . Smokeless tobacco: Never Used  . Alcohol use No     Allergies   Patient has no  known allergies.   Review of Systems Review of Systems  All other systems reviewed and are negative.    Physical Exam Updated Vital Signs BP (!) 158/77 (BP Location: Left Arm)   Pulse 99   Temp 98.3 F (36.8 C) (Oral)   Resp 20   Ht 5' (1.524 m)   Wt 190 lb (86.2 kg)   SpO2 99%   BMI 37.11 kg/m   Physical Exam  Constitutional: She is oriented to person, place, and time. She appears well-developed and well-nourished. No distress.  HENT:  Head: Normocephalic.  Nose: Nose normal.  Eyes: Conjunctivae are normal.  Neck: Neck supple. No tracheal deviation present.  Cardiovascular: Normal rate, regular rhythm  and normal heart sounds.  Exam reveals no gallop and no friction rub.   No murmur heard. Pulmonary/Chest: Effort normal and breath sounds normal. No respiratory distress. She has no wheezes. She has no rales.  Abdominal: Soft. She exhibits no distension.  Neurological: She is alert and oriented to person, place, and time.  Skin: Skin is warm and dry.  Psychiatric: She has a normal mood and affect.     ED Treatments / Results  DIAGNOSTIC STUDIES: Oxygen Saturation is 99% on RA, nl by my interpretation.    COORDINATION OF CARE: 8:23 PM Discussed treatment plan with pt at bedside which includes CXR, tessalon perles Rx, afrin Rx, nasacort Rx, and pt agreed to plan.   Radiology Dg Chest 2 View  Result Date: 04/27/2016 CLINICAL DATA:  Cough and congestion EXAM: CHEST  2 VIEW COMPARISON:  02/13/2009 FINDINGS: The heart size and mediastinal contours are within normal limits. Scar noted within the right middle lobe. Both lungs are otherwise clear. The visualized skeletal structures are unremarkable. IMPRESSION: No active cardiopulmonary disease. Electronically Signed   By: Kerby Moors M.D.   On: 04/27/2016 19:59    Procedures Procedures (including critical care time)  Medications Ordered in ED Medications - No data to display   Initial Impression / Assessment and Plan / ED Course  I have reviewed the triage vital signs and the nursing notes.  Pertinent imaging results that were available during my care of the patient were reviewed by me and considered in my medical decision making (see chart for details).     66 y.o. female presents with cough since yesterday. Has had runny nose as well, suspect viral vs allergic rhinitis and secondary URI. No signs of pna on CXR. Supportive care measures initiated. Plan to follow up with PCP as needed and return precautions discussed for worsening or new concerning symptoms.   Final Clinical Impressions(s) / ED Diagnoses   Final diagnoses:  Viral  URI with cough    New Prescriptions Discharge Medication List as of 04/27/2016  8:27 PM    START taking these medications   Details  benzonatate (TESSALON) 100 MG capsule Take 1 capsule (100 mg total) by mouth every 8 (eight) hours., Starting Sun 04/27/2016, Print    fexofenadine (ALLEGRA) 60 MG tablet Take 1 tablet (60 mg total) by mouth 2 (two) times daily., Starting Sun 04/27/2016, Until Tue 05/27/2016, Print    mometasone (NASONEX) 50 MCG/ACT nasal spray Place 2 sprays into the nose daily., Starting Sun 04/27/2016, Print       I personally performed the services described in this documentation, which was scribed in my presence. The recorded information has been reviewed and is accurate.       Leo Grosser, MD 04/28/16 [phone]

## 2016-04-27 NOTE — ED Triage Notes (Signed)
Cough and SOB x 1 week, sore throat. Taking mucinex and home.

## 2016-04-27 NOTE — ED Notes (Signed)
Pt given d/c instructions as per chart. Rx x 3. Verbalizes understanding. No questions. 

## 2016-04-27 NOTE — ED Notes (Signed)
Alert, NAD, calm, interactive, resps e/u, speaking in clear complete sentences, no dyspnea noted, skin W&D, VSS, c/o URI allergy sx, post nasal drainage, nasal congestion, cough, mentions soreness from coughing (ribs and throat), (denies: pain, sob, nausea, dizziness or visual changes), pending xray results. Family at [facility]. Prefers to sit upright on bed.

## 2016-04-28 DIAGNOSIS — E1140 Type 2 diabetes mellitus with diabetic neuropathy, unspecified: Secondary | ICD-10-CM | POA: Diagnosis not present

## 2016-04-28 DIAGNOSIS — Z01118 Encounter for examination of ears and hearing with other abnormal findings: Secondary | ICD-10-CM | POA: Diagnosis not present

## 2016-04-28 DIAGNOSIS — M109 Gout, unspecified: Secondary | ICD-10-CM | POA: Diagnosis not present

## 2016-04-28 DIAGNOSIS — Z0000 Encounter for general adult medical examination without abnormal findings: Secondary | ICD-10-CM | POA: Diagnosis not present

## 2016-04-28 DIAGNOSIS — G4733 Obstructive sleep apnea (adult) (pediatric): Secondary | ICD-10-CM | POA: Diagnosis not present

## 2016-04-28 DIAGNOSIS — R05 Cough: Secondary | ICD-10-CM | POA: Diagnosis not present

## 2016-04-28 DIAGNOSIS — K7581 Nonalcoholic steatohepatitis (NASH): Secondary | ICD-10-CM | POA: Diagnosis not present

## 2016-04-28 DIAGNOSIS — I10 Essential (primary) hypertension: Secondary | ICD-10-CM | POA: Diagnosis not present

## 2016-04-28 DIAGNOSIS — E119 Type 2 diabetes mellitus without complications: Secondary | ICD-10-CM | POA: Diagnosis not present

## 2016-04-28 DIAGNOSIS — E039 Hypothyroidism, unspecified: Secondary | ICD-10-CM | POA: Diagnosis not present

## 2016-04-28 DIAGNOSIS — E785 Hyperlipidemia, unspecified: Secondary | ICD-10-CM | POA: Diagnosis not present

## 2016-04-28 DIAGNOSIS — E559 Vitamin D deficiency, unspecified: Secondary | ICD-10-CM | POA: Diagnosis not present

## 2016-09-05 DIAGNOSIS — E785 Hyperlipidemia, unspecified: Secondary | ICD-10-CM | POA: Diagnosis not present

## 2016-09-05 DIAGNOSIS — N951 Menopausal and female climacteric states: Secondary | ICD-10-CM | POA: Diagnosis not present

## 2016-09-05 DIAGNOSIS — K7581 Nonalcoholic steatohepatitis (NASH): Secondary | ICD-10-CM | POA: Diagnosis not present

## 2016-09-05 DIAGNOSIS — E119 Type 2 diabetes mellitus without complications: Secondary | ICD-10-CM | POA: Diagnosis not present

## 2016-09-05 DIAGNOSIS — G4733 Obstructive sleep apnea (adult) (pediatric): Secondary | ICD-10-CM | POA: Diagnosis not present

## 2016-09-05 DIAGNOSIS — E1140 Type 2 diabetes mellitus with diabetic neuropathy, unspecified: Secondary | ICD-10-CM | POA: Diagnosis not present

## 2016-09-05 DIAGNOSIS — Z0000 Encounter for general adult medical examination without abnormal findings: Secondary | ICD-10-CM | POA: Diagnosis not present

## 2016-09-05 DIAGNOSIS — E039 Hypothyroidism, unspecified: Secondary | ICD-10-CM | POA: Diagnosis not present

## 2016-09-05 DIAGNOSIS — I10 Essential (primary) hypertension: Secondary | ICD-10-CM | POA: Diagnosis not present

## 2016-09-05 DIAGNOSIS — E559 Vitamin D deficiency, unspecified: Secondary | ICD-10-CM | POA: Diagnosis not present

## 2016-09-05 DIAGNOSIS — M109 Gout, unspecified: Secondary | ICD-10-CM | POA: Diagnosis not present

## 2016-09-12 DIAGNOSIS — Z1231 Encounter for screening mammogram for malignant neoplasm of breast: Secondary | ICD-10-CM | POA: Diagnosis not present

## 2016-10-13 DIAGNOSIS — Z780 Asymptomatic menopausal state: Secondary | ICD-10-CM | POA: Diagnosis not present

## 2016-10-13 DIAGNOSIS — Z01419 Encounter for gynecological examination (general) (routine) without abnormal findings: Secondary | ICD-10-CM | POA: Diagnosis not present

## 2016-10-17 DIAGNOSIS — M549 Dorsalgia, unspecified: Secondary | ICD-10-CM | POA: Diagnosis not present

## 2016-10-17 DIAGNOSIS — E1140 Type 2 diabetes mellitus with diabetic neuropathy, unspecified: Secondary | ICD-10-CM | POA: Diagnosis not present

## 2016-10-17 DIAGNOSIS — N951 Menopausal and female climacteric states: Secondary | ICD-10-CM | POA: Diagnosis not present

## 2016-10-17 DIAGNOSIS — Z0000 Encounter for general adult medical examination without abnormal findings: Secondary | ICD-10-CM | POA: Diagnosis not present

## 2016-10-17 DIAGNOSIS — M109 Gout, unspecified: Secondary | ICD-10-CM | POA: Diagnosis not present

## 2016-10-17 DIAGNOSIS — E119 Type 2 diabetes mellitus without complications: Secondary | ICD-10-CM | POA: Diagnosis not present

## 2016-10-17 DIAGNOSIS — I10 Essential (primary) hypertension: Secondary | ICD-10-CM | POA: Diagnosis not present

## 2016-10-17 DIAGNOSIS — E039 Hypothyroidism, unspecified: Secondary | ICD-10-CM | POA: Diagnosis not present

## 2016-10-17 DIAGNOSIS — E559 Vitamin D deficiency, unspecified: Secondary | ICD-10-CM | POA: Diagnosis not present

## 2016-11-07 DIAGNOSIS — E039 Hypothyroidism, unspecified: Secondary | ICD-10-CM | POA: Diagnosis not present

## 2016-11-07 DIAGNOSIS — I10 Essential (primary) hypertension: Secondary | ICD-10-CM | POA: Diagnosis not present

## 2016-11-07 DIAGNOSIS — E559 Vitamin D deficiency, unspecified: Secondary | ICD-10-CM | POA: Diagnosis not present

## 2016-11-07 DIAGNOSIS — N951 Menopausal and female climacteric states: Secondary | ICD-10-CM | POA: Diagnosis not present

## 2016-11-07 DIAGNOSIS — E119 Type 2 diabetes mellitus without complications: Secondary | ICD-10-CM | POA: Diagnosis not present

## 2016-11-07 DIAGNOSIS — M549 Dorsalgia, unspecified: Secondary | ICD-10-CM | POA: Diagnosis not present

## 2016-11-07 DIAGNOSIS — M109 Gout, unspecified: Secondary | ICD-10-CM | POA: Diagnosis not present

## 2016-11-07 DIAGNOSIS — E1140 Type 2 diabetes mellitus with diabetic neuropathy, unspecified: Secondary | ICD-10-CM | POA: Diagnosis not present

## 2016-11-24 DIAGNOSIS — K648 Other hemorrhoids: Secondary | ICD-10-CM | POA: Diagnosis not present

## 2016-11-24 DIAGNOSIS — D126 Benign neoplasm of colon, unspecified: Secondary | ICD-10-CM | POA: Diagnosis not present

## 2016-11-24 DIAGNOSIS — Z1211 Encounter for screening for malignant neoplasm of colon: Secondary | ICD-10-CM | POA: Diagnosis not present

## 2016-11-24 DIAGNOSIS — K6389 Other specified diseases of intestine: Secondary | ICD-10-CM | POA: Diagnosis not present

## 2016-11-27 DIAGNOSIS — D126 Benign neoplasm of colon, unspecified: Secondary | ICD-10-CM | POA: Diagnosis not present

## 2016-11-27 DIAGNOSIS — Z1211 Encounter for screening for malignant neoplasm of colon: Secondary | ICD-10-CM | POA: Diagnosis not present

## 2017-04-23 DIAGNOSIS — E119 Type 2 diabetes mellitus without complications: Secondary | ICD-10-CM | POA: Diagnosis not present

## 2017-04-23 DIAGNOSIS — E1140 Type 2 diabetes mellitus with diabetic neuropathy, unspecified: Secondary | ICD-10-CM | POA: Diagnosis not present

## 2017-04-23 DIAGNOSIS — N951 Menopausal and female climacteric states: Secondary | ICD-10-CM | POA: Diagnosis not present

## 2017-04-23 DIAGNOSIS — M549 Dorsalgia, unspecified: Secondary | ICD-10-CM | POA: Diagnosis not present

## 2017-04-23 DIAGNOSIS — Z0110 Encounter for examination of ears and hearing without abnormal findings: Secondary | ICD-10-CM | POA: Diagnosis not present

## 2017-04-23 DIAGNOSIS — J069 Acute upper respiratory infection, unspecified: Secondary | ICD-10-CM | POA: Diagnosis not present

## 2017-04-23 DIAGNOSIS — E039 Hypothyroidism, unspecified: Secondary | ICD-10-CM | POA: Diagnosis not present

## 2017-04-23 DIAGNOSIS — M109 Gout, unspecified: Secondary | ICD-10-CM | POA: Diagnosis not present

## 2017-04-23 DIAGNOSIS — Z0000 Encounter for general adult medical examination without abnormal findings: Secondary | ICD-10-CM | POA: Diagnosis not present

## 2017-04-23 DIAGNOSIS — E559 Vitamin D deficiency, unspecified: Secondary | ICD-10-CM | POA: Diagnosis not present

## 2017-04-23 DIAGNOSIS — I10 Essential (primary) hypertension: Secondary | ICD-10-CM | POA: Diagnosis not present

## 2017-05-06 DIAGNOSIS — G4733 Obstructive sleep apnea (adult) (pediatric): Secondary | ICD-10-CM | POA: Diagnosis not present

## 2017-05-06 DIAGNOSIS — E039 Hypothyroidism, unspecified: Secondary | ICD-10-CM | POA: Diagnosis not present

## 2017-10-19 DIAGNOSIS — Z0001 Encounter for general adult medical examination with abnormal findings: Secondary | ICD-10-CM | POA: Diagnosis not present

## 2017-10-19 DIAGNOSIS — M549 Dorsalgia, unspecified: Secondary | ICD-10-CM | POA: Diagnosis not present

## 2017-10-19 DIAGNOSIS — E1140 Type 2 diabetes mellitus with diabetic neuropathy, unspecified: Secondary | ICD-10-CM | POA: Diagnosis not present

## 2017-10-19 DIAGNOSIS — I10 Essential (primary) hypertension: Secondary | ICD-10-CM | POA: Diagnosis not present

## 2017-10-19 DIAGNOSIS — Z0101 Encounter for examination of eyes and vision with abnormal findings: Secondary | ICD-10-CM | POA: Diagnosis not present

## 2017-10-19 DIAGNOSIS — M109 Gout, unspecified: Secondary | ICD-10-CM | POA: Diagnosis not present

## 2017-10-19 DIAGNOSIS — Z2821 Immunization not carried out because of patient refusal: Secondary | ICD-10-CM | POA: Diagnosis not present

## 2017-10-19 DIAGNOSIS — E0590 Thyrotoxicosis, unspecified without thyrotoxic crisis or storm: Secondary | ICD-10-CM | POA: Diagnosis not present

## 2017-10-19 DIAGNOSIS — N951 Menopausal and female climacteric states: Secondary | ICD-10-CM | POA: Diagnosis not present

## 2017-10-19 DIAGNOSIS — R3129 Other microscopic hematuria: Secondary | ICD-10-CM | POA: Diagnosis not present

## 2017-10-19 DIAGNOSIS — E039 Hypothyroidism, unspecified: Secondary | ICD-10-CM | POA: Diagnosis not present

## 2017-10-19 DIAGNOSIS — E559 Vitamin D deficiency, unspecified: Secondary | ICD-10-CM | POA: Diagnosis not present

## 2017-10-19 DIAGNOSIS — E119 Type 2 diabetes mellitus without complications: Secondary | ICD-10-CM | POA: Diagnosis not present

## 2017-11-02 DIAGNOSIS — M549 Dorsalgia, unspecified: Secondary | ICD-10-CM | POA: Diagnosis not present

## 2017-11-02 DIAGNOSIS — I10 Essential (primary) hypertension: Secondary | ICD-10-CM | POA: Diagnosis not present

## 2017-11-02 DIAGNOSIS — M109 Gout, unspecified: Secondary | ICD-10-CM | POA: Diagnosis not present

## 2017-11-02 DIAGNOSIS — N951 Menopausal and female climacteric states: Secondary | ICD-10-CM | POA: Diagnosis not present

## 2017-11-02 DIAGNOSIS — E119 Type 2 diabetes mellitus without complications: Secondary | ICD-10-CM | POA: Diagnosis not present

## 2017-11-02 DIAGNOSIS — R3129 Other microscopic hematuria: Secondary | ICD-10-CM | POA: Diagnosis not present

## 2017-11-02 DIAGNOSIS — E1140 Type 2 diabetes mellitus with diabetic neuropathy, unspecified: Secondary | ICD-10-CM | POA: Diagnosis not present

## 2017-11-02 DIAGNOSIS — E559 Vitamin D deficiency, unspecified: Secondary | ICD-10-CM | POA: Diagnosis not present

## 2017-11-02 DIAGNOSIS — E039 Hypothyroidism, unspecified: Secondary | ICD-10-CM | POA: Diagnosis not present

## 2017-11-04 DIAGNOSIS — Z1231 Encounter for screening mammogram for malignant neoplasm of breast: Secondary | ICD-10-CM | POA: Diagnosis not present

## 2017-11-09 DIAGNOSIS — E119 Type 2 diabetes mellitus without complications: Secondary | ICD-10-CM | POA: Diagnosis not present

## 2017-11-09 DIAGNOSIS — N951 Menopausal and female climacteric states: Secondary | ICD-10-CM | POA: Diagnosis not present

## 2017-11-09 DIAGNOSIS — Z23 Encounter for immunization: Secondary | ICD-10-CM | POA: Diagnosis not present

## 2017-11-09 DIAGNOSIS — R3129 Other microscopic hematuria: Secondary | ICD-10-CM | POA: Diagnosis not present

## 2017-11-09 DIAGNOSIS — E039 Hypothyroidism, unspecified: Secondary | ICD-10-CM | POA: Diagnosis not present

## 2017-11-09 DIAGNOSIS — E1140 Type 2 diabetes mellitus with diabetic neuropathy, unspecified: Secondary | ICD-10-CM | POA: Diagnosis not present

## 2017-11-09 DIAGNOSIS — E559 Vitamin D deficiency, unspecified: Secondary | ICD-10-CM | POA: Diagnosis not present

## 2017-11-09 DIAGNOSIS — I10 Essential (primary) hypertension: Secondary | ICD-10-CM | POA: Diagnosis not present

## 2017-11-09 DIAGNOSIS — M549 Dorsalgia, unspecified: Secondary | ICD-10-CM | POA: Diagnosis not present

## 2017-12-07 DIAGNOSIS — M1A09X0 Idiopathic chronic gout, multiple sites, without tophus (tophi): Secondary | ICD-10-CM | POA: Diagnosis not present

## 2017-12-07 DIAGNOSIS — M19042 Primary osteoarthritis, left hand: Secondary | ICD-10-CM | POA: Diagnosis not present

## 2017-12-07 DIAGNOSIS — M19041 Primary osteoarthritis, right hand: Secondary | ICD-10-CM | POA: Diagnosis not present

## 2018-01-14 DIAGNOSIS — I10 Essential (primary) hypertension: Secondary | ICD-10-CM | POA: Diagnosis not present

## 2018-01-14 DIAGNOSIS — E559 Vitamin D deficiency, unspecified: Secondary | ICD-10-CM | POA: Diagnosis not present

## 2018-01-14 DIAGNOSIS — G44209 Tension-type headache, unspecified, not intractable: Secondary | ICD-10-CM | POA: Diagnosis not present

## 2018-01-14 DIAGNOSIS — M109 Gout, unspecified: Secondary | ICD-10-CM | POA: Diagnosis not present

## 2018-01-14 DIAGNOSIS — M549 Dorsalgia, unspecified: Secondary | ICD-10-CM | POA: Diagnosis not present

## 2018-01-14 DIAGNOSIS — E119 Type 2 diabetes mellitus without complications: Secondary | ICD-10-CM | POA: Diagnosis not present

## 2018-01-14 DIAGNOSIS — E1140 Type 2 diabetes mellitus with diabetic neuropathy, unspecified: Secondary | ICD-10-CM | POA: Diagnosis not present

## 2018-01-14 DIAGNOSIS — E039 Hypothyroidism, unspecified: Secondary | ICD-10-CM | POA: Diagnosis not present

## 2018-01-14 DIAGNOSIS — N951 Menopausal and female climacteric states: Secondary | ICD-10-CM | POA: Diagnosis not present

## 2018-03-12 ENCOUNTER — Emergency Department (HOSPITAL_BASED_OUTPATIENT_CLINIC_OR_DEPARTMENT_OTHER)
Admission: EM | Admit: 2018-03-12 | Discharge: 2018-03-12 | Disposition: A | Discharge status: Home / Self Care | Payer: PPO | Attending: Emergency Medicine | Admitting: Emergency Medicine

## 2018-03-12 ENCOUNTER — Other Ambulatory Visit: Payer: Self-pay

## 2018-03-12 ENCOUNTER — Encounter (HOSPITAL_BASED_OUTPATIENT_CLINIC_OR_DEPARTMENT_OTHER): Payer: Self-pay | Admitting: Adult Health

## 2018-03-12 ENCOUNTER — Emergency Department (HOSPITAL_BASED_OUTPATIENT_CLINIC_OR_DEPARTMENT_OTHER): Payer: PPO

## 2018-03-12 DIAGNOSIS — E119 Type 2 diabetes mellitus without complications: Secondary | ICD-10-CM | POA: Diagnosis not present

## 2018-03-12 DIAGNOSIS — R05 Cough: Secondary | ICD-10-CM | POA: Diagnosis not present

## 2018-03-12 DIAGNOSIS — J069 Acute upper respiratory infection, unspecified: Secondary | ICD-10-CM | POA: Insufficient documentation

## 2018-03-12 DIAGNOSIS — Z7984 Long term (current) use of oral hypoglycemic drugs: Secondary | ICD-10-CM | POA: Diagnosis not present

## 2018-03-12 DIAGNOSIS — B9789 Other viral agents as the cause of diseases classified elsewhere: Secondary | ICD-10-CM | POA: Diagnosis not present

## 2018-03-12 DIAGNOSIS — I10 Essential (primary) hypertension: Secondary | ICD-10-CM | POA: Diagnosis not present

## 2018-03-12 DIAGNOSIS — Z79899 Other long term (current) drug therapy: Secondary | ICD-10-CM | POA: Diagnosis not present

## 2018-03-12 DIAGNOSIS — R509 Fever, unspecified: Secondary | ICD-10-CM | POA: Diagnosis not present

## 2018-03-12 MED ORDER — HYDROCODONE-HOMATROPINE 5-1.5 MG/5ML PO SYRP: 5.0000 mL | ORAL_SOLUTION | Freq: Four times a day (QID) | ORAL | 0 refills | Status: AC | PRN

## 2018-03-12 MED FILL — HYDROCODONE-HOMATROPINE SYR: 5-1.5 | 6 days supply | Qty: 120 | Fill #0

## 2018-03-12 NOTE — ED Triage Notes (Signed)
Presents with cough, that is severe associated a subjective fever last night. She endorses chills, body aches, sore throat and nausea.

## 2018-03-12 NOTE — ED Notes (Signed)
MD Belfi at bedside to discuss plan of care with patient at this time

## 2018-03-12 NOTE — ED Provider Notes (Signed)
[facility] EMERGENCY DEPARTMENT Provider Note   CSN: [id number] Arrival date & time: 03/12/18  1037     History   Chief Complaint Chief Complaint  Patient presents with  . Cough    HPI Robin Wallace is a 68 y.o. female.  Patient is a 68 year old female who presents with a cough.  She reports a 2-day history of cough which is productive of some white-yellow phlegm.  She has a little bit of nasal congestion.  She had a subjective fever last night.  No shortness of breath.  She has had some nausea but no vomiting.  No diarrhea.  She has been using Coricidin with no improvement in symptoms.     Past Medical History:  Diagnosis Date  . Diabetes mellitus without complication ([place])   . Gout   . High cholesterol   . Hypertension   . Thyroid disease     Patient Active Problem List   Diagnosis Date Noted  . Gout 06/14/2014  . Ruptured, tendon, nontraumatic 06/07/2014  . Difficulty in walking involving ankle and foot joint 06/07/2014  . Pain in lower limb 06/07/2014    Past Surgical History:  Procedure Laterality Date  . ABDOMINAL HYSTERECTOMY    . TONSILLECTOMY       OB History   No obstetric history on file.      Home Medications    Prior to Admission medications   Medication Sig Start Date End Date Taking? Authorizing Provider  atorvastatin (LIPITOR) 40 MG tablet  03/14/14   [provider]  azithromycin (ZITHROMAX) 250 MG tablet  03/15/14   [provider]  benzonatate (TESSALON) 100 MG capsule Take 1 capsule (100 mg total) by mouth every 8 (eight) hours. 04/27/16   Leo Grosser, MD  cephALEXin (KEFLEX) 500 MG capsule Take 1 capsule (500 mg total) by mouth 2 (two) times daily. 12/20/14   [name], [name], MD  colchicine (COLCRYS) 0.6 MG tablet Take 0.6 mg by mouth daily.    [provider]  fexofenadine (ALLEGRA) 60 MG tablet Take 1 tablet (60 mg total) by mouth 2 (two) times daily. 04/27/16 05/27/16  Leo Grosser, MD    HYDROcodone-acetaminophen (NORCO/VICODIN) 5-325 MG per tablet Take 1 tablet by mouth every 4 (four) hours as needed. 06/05/14   [name], Delice Bison, DO  HYDROcodone-homatropine (HYCODAN) 5-1.5 MG/5ML syrup Take 5 mLs by mouth every 6 (six) hours as needed for cough. 03/12/18   Malvin Johns, MD  ibuprofen (ADVIL,MOTRIN) 800 MG tablet Take 1 tablet (800 mg total) by mouth every 8 (eight) hours as needed for mild pain. 06/05/14   [name], Delice Bison, DO  levothyroxine (SYNTHROID, LEVOTHROID) 50 MCG tablet Take 50 mcg by mouth daily before breakfast.    [provider]  lisinopril-hydrochlorothiazide (PRINZIDE,ZESTORETIC) 20-25 MG per tablet Take 1 tablet by mouth daily.    [provider]  metFORMIN (GLUCOPHAGE) 500 MG tablet Take 500 mg by mouth daily with breakfast.    [provider]  mometasone (NASONEX) 50 MCG/ACT nasal spray Place 2 sprays into the nose daily. 04/27/16   Leo Grosser, MD  predniSONE (DELTASONE) 20 MG tablet 3 tabs po day one, then 2 po daily x 4 days 08/16/12   Cleatrice Burke, [name]  promethazine (PHENERGAN) 25 MG tablet Take 1 tablet (25 mg total) by mouth every 6 (six) hours as needed for nausea. 08/16/12   Cleatrice Burke, [name]  SIMVASTATIN PO Take by mouth.    [provider]  Family History History reviewed. No pertinent family history.  Social History Social History   Tobacco Use  . Smoking status: Never Smoker  . Smokeless tobacco: Never Used  Substance Use Topics  . Alcohol use: No    Alcohol/week: 0.0 standard drinks  . Drug use: No     Allergies   Patient has no known allergies.   Review of Systems Review of Systems  Constitutional: Positive for fatigue and fever. Negative for chills and diaphoresis.  HENT: Positive for congestion. Negative for rhinorrhea and sneezing.   Eyes: Negative.   Respiratory: Positive for cough. Negative for chest tightness and shortness of breath.   Cardiovascular: Negative for chest pain and leg  swelling.  Gastrointestinal: Positive for nausea. Negative for abdominal pain, blood in stool, diarrhea and vomiting.  Genitourinary: Negative for difficulty urinating, flank pain, frequency and hematuria.  Musculoskeletal: Negative for arthralgias and back pain.  Skin: Negative for rash.  Neurological: Negative for dizziness, speech difficulty, weakness, numbness and headaches.     Physical Exam Updated Vital Signs BP (!) 130/57 (BP Location: Left Arm)   Pulse 98   Temp 98.3 F (36.8 C) (Oral)   Resp 20   Ht 5' (1.524 m)   Wt 81.6 kg   SpO2 97%   BMI 35.15 kg/m   Physical Exam Constitutional:      Appearance: She is well-developed.  HENT:     Head: Normocephalic and atraumatic.  Eyes:     Pupils: Pupils are equal, round, and reactive to light.  Neck:     Musculoskeletal: Normal range of motion and neck supple.  Cardiovascular:     Rate and Rhythm: Normal rate and regular rhythm.     Heart sounds: Normal heart sounds.  Pulmonary:     Effort: Pulmonary effort is normal. No respiratory distress.     Breath sounds: Normal breath sounds. No wheezing or rales.  Chest:     Chest wall: No tenderness.  Abdominal:     General: Bowel sounds are normal.     Palpations: Abdomen is soft.     Tenderness: There is no abdominal tenderness. There is no guarding or rebound.  Musculoskeletal: Normal range of motion.  Lymphadenopathy:     Cervical: No cervical adenopathy.  Skin:    General: Skin is warm and dry.     Findings: No rash.  Neurological:     Mental Status: She is alert and oriented to person, place, and time.      ED Treatments / Results  Labs (all labs ordered are listed, but only abnormal results are displayed) Labs Reviewed - No data to display  EKG None  Radiology Dg Chest 2 View  Result Date: 03/12/2018 CLINICAL DATA:  Cough, fever. EXAM: CHEST - 2 VIEW COMPARISON:  Radiographs of April 27, 2016. FINDINGS: The heart size and mediastinal contours are  within normal limits. No pneumothorax or pleural effusion is noted. Stable probable scarring seen in right middle lobe. No acute pulmonary disease is noted. The visualized skeletal structures are unremarkable. IMPRESSION: No active cardiopulmonary disease. Electronically Signed   By: Marijo Conception, M.D.   On: 03/12/2018 11:57    Procedures Procedures (including critical care time)  Medications Ordered in ED Medications - No data to display   Initial Impression / Assessment and Plan / ED Course  I have reviewed the triage vital signs and the nursing notes.  Pertinent labs & imaging results that were available during my care of the patient were  reviewed by me and considered in my medical decision making (see chart for details).     Patient is a 68 year old female who presents with a cough.  She has no shortness of breath.  No hypoxia.  Chest x-ray shows no evidence of pneumonia.  This is likely viral.  She was discharged home in good condition.  She was given a prescription for Hycodan cough syrup.  She was encouraged to follow-up with her PCP if her symptoms are not improving.  Return precautions given.  Final Clinical Impressions(s) / ED Diagnoses   Final diagnoses:  Viral URI with cough    ED Discharge Orders         Ordered    HYDROcodone-homatropine (HYCODAN) 5-1.5 MG/5ML syrup  Every 6 hours PRN     03/12/18 1252           Malvin Johns, MD 03/12/18 1253

## 2018-03-22 DIAGNOSIS — E039 Hypothyroidism, unspecified: Secondary | ICD-10-CM | POA: Diagnosis not present

## 2018-03-22 DIAGNOSIS — I10 Essential (primary) hypertension: Secondary | ICD-10-CM | POA: Diagnosis not present

## 2018-03-22 DIAGNOSIS — E1140 Type 2 diabetes mellitus with diabetic neuropathy, unspecified: Secondary | ICD-10-CM | POA: Diagnosis not present

## 2018-03-22 DIAGNOSIS — E559 Vitamin D deficiency, unspecified: Secondary | ICD-10-CM | POA: Diagnosis not present

## 2018-03-22 DIAGNOSIS — E119 Type 2 diabetes mellitus without complications: Secondary | ICD-10-CM | POA: Diagnosis not present

## 2018-03-22 DIAGNOSIS — M549 Dorsalgia, unspecified: Secondary | ICD-10-CM | POA: Diagnosis not present

## 2018-03-22 DIAGNOSIS — R05 Cough: Secondary | ICD-10-CM | POA: Diagnosis not present

## 2018-03-22 DIAGNOSIS — M109 Gout, unspecified: Secondary | ICD-10-CM | POA: Diagnosis not present

## 2018-05-17 DIAGNOSIS — Z01021 Encounter for examination of eyes and vision following failed vision screening with abnormal findings: Secondary | ICD-10-CM | POA: Diagnosis not present

## 2018-05-17 DIAGNOSIS — M549 Dorsalgia, unspecified: Secondary | ICD-10-CM | POA: Diagnosis not present

## 2018-05-17 DIAGNOSIS — Z5181 Encounter for therapeutic drug level monitoring: Secondary | ICD-10-CM | POA: Diagnosis not present

## 2018-05-17 DIAGNOSIS — Z136 Encounter for screening for cardiovascular disorders: Secondary | ICD-10-CM | POA: Diagnosis not present

## 2018-05-17 DIAGNOSIS — E1140 Type 2 diabetes mellitus with diabetic neuropathy, unspecified: Secondary | ICD-10-CM | POA: Diagnosis not present

## 2018-05-17 DIAGNOSIS — Z1389 Encounter for screening for other disorder: Secondary | ICD-10-CM | POA: Diagnosis not present

## 2018-05-17 DIAGNOSIS — I10 Essential (primary) hypertension: Secondary | ICD-10-CM | POA: Diagnosis not present

## 2018-05-17 DIAGNOSIS — Z1329 Encounter for screening for other suspected endocrine disorder: Secondary | ICD-10-CM | POA: Diagnosis not present

## 2018-05-17 DIAGNOSIS — E559 Vitamin D deficiency, unspecified: Secondary | ICD-10-CM | POA: Diagnosis not present

## 2018-05-17 DIAGNOSIS — Z131 Encounter for screening for diabetes mellitus: Secondary | ICD-10-CM | POA: Diagnosis not present

## 2018-05-17 DIAGNOSIS — E119 Type 2 diabetes mellitus without complications: Secondary | ICD-10-CM | POA: Diagnosis not present

## 2018-05-17 DIAGNOSIS — E039 Hypothyroidism, unspecified: Secondary | ICD-10-CM | POA: Diagnosis not present

## 2018-05-17 DIAGNOSIS — Z0001 Encounter for general adult medical examination with abnormal findings: Secondary | ICD-10-CM | POA: Diagnosis not present

## 2018-05-17 DIAGNOSIS — M109 Gout, unspecified: Secondary | ICD-10-CM | POA: Diagnosis not present

## 2018-05-17 DIAGNOSIS — Z0100 Encounter for examination of eyes and vision without abnormal findings: Secondary | ICD-10-CM | POA: Diagnosis not present

## 2018-05-17 DIAGNOSIS — Z01118 Encounter for examination of ears and hearing with other abnormal findings: Secondary | ICD-10-CM | POA: Diagnosis not present

## 2018-06-28 DIAGNOSIS — E039 Hypothyroidism, unspecified: Secondary | ICD-10-CM | POA: Diagnosis not present

## 2018-06-28 DIAGNOSIS — I10 Essential (primary) hypertension: Secondary | ICD-10-CM | POA: Diagnosis not present

## 2018-06-28 DIAGNOSIS — E119 Type 2 diabetes mellitus without complications: Secondary | ICD-10-CM | POA: Diagnosis not present

## 2018-06-28 DIAGNOSIS — Z0001 Encounter for general adult medical examination with abnormal findings: Secondary | ICD-10-CM | POA: Diagnosis not present

## 2018-06-28 DIAGNOSIS — M109 Gout, unspecified: Secondary | ICD-10-CM | POA: Diagnosis not present

## 2018-06-28 DIAGNOSIS — E559 Vitamin D deficiency, unspecified: Secondary | ICD-10-CM | POA: Diagnosis not present

## 2018-06-28 DIAGNOSIS — E1140 Type 2 diabetes mellitus with diabetic neuropathy, unspecified: Secondary | ICD-10-CM | POA: Diagnosis not present

## 2018-06-28 DIAGNOSIS — M549 Dorsalgia, unspecified: Secondary | ICD-10-CM | POA: Diagnosis not present

## 2018-07-05 DIAGNOSIS — E039 Hypothyroidism, unspecified: Secondary | ICD-10-CM | POA: Diagnosis not present

## 2018-07-05 DIAGNOSIS — E1140 Type 2 diabetes mellitus with diabetic neuropathy, unspecified: Secondary | ICD-10-CM | POA: Diagnosis not present

## 2018-07-05 DIAGNOSIS — E559 Vitamin D deficiency, unspecified: Secondary | ICD-10-CM | POA: Diagnosis not present

## 2018-07-05 DIAGNOSIS — E119 Type 2 diabetes mellitus without complications: Secondary | ICD-10-CM | POA: Diagnosis not present

## 2018-07-05 DIAGNOSIS — M109 Gout, unspecified: Secondary | ICD-10-CM | POA: Diagnosis not present

## 2018-07-05 DIAGNOSIS — I10 Essential (primary) hypertension: Secondary | ICD-10-CM | POA: Diagnosis not present

## 2018-07-10 ENCOUNTER — Other Ambulatory Visit: Payer: Self-pay

## 2018-07-10 ENCOUNTER — Encounter (HOSPITAL_BASED_OUTPATIENT_CLINIC_OR_DEPARTMENT_OTHER): Payer: Self-pay | Admitting: Emergency Medicine

## 2018-07-10 ENCOUNTER — Emergency Department (HOSPITAL_BASED_OUTPATIENT_CLINIC_OR_DEPARTMENT_OTHER)
Admission: EM | Admit: 2018-07-10 | Discharge: 2018-07-10 | Disposition: A | Discharge status: Home / Self Care | Payer: PPO | Attending: Emergency Medicine | Admitting: Emergency Medicine

## 2018-07-10 DIAGNOSIS — M5442 Lumbago with sciatica, left side: Secondary | ICD-10-CM | POA: Diagnosis not present

## 2018-07-10 DIAGNOSIS — Z79899 Other long term (current) drug therapy: Secondary | ICD-10-CM | POA: Insufficient documentation

## 2018-07-10 DIAGNOSIS — M109 Gout, unspecified: Secondary | ICD-10-CM | POA: Diagnosis not present

## 2018-07-10 DIAGNOSIS — M5432 Sciatica, left side: Secondary | ICD-10-CM | POA: Diagnosis not present

## 2018-07-10 DIAGNOSIS — M545 Low back pain: Secondary | ICD-10-CM | POA: Diagnosis present

## 2018-07-10 DIAGNOSIS — E119 Type 2 diabetes mellitus without complications: Secondary | ICD-10-CM | POA: Insufficient documentation

## 2018-07-10 DIAGNOSIS — I10 Essential (primary) hypertension: Secondary | ICD-10-CM | POA: Insufficient documentation

## 2018-07-10 MED ORDER — LIDOCAINE 5 % EX PTCH: 1.0000 | MEDICATED_PATCH | CUTANEOUS | 0 refills | Status: AC

## 2018-07-10 MED ORDER — DIAZEPAM 5 MG/ML IJ SOLN
5.0000 mg | Freq: Once | INTRAMUSCULAR | Status: AC
  Administered 2018-07-10: 15:00:00 5 mg via INTRAMUSCULAR
  Filled 2018-07-10: qty 2

## 2018-07-10 MED ORDER — HYDROCODONE-ACETAMINOPHEN 5-325 MG PO TABS
1.0000 | ORAL_TABLET | Freq: Once | ORAL | Status: AC
  Administered 2018-07-10: 1 via ORAL
  Filled 2018-07-10: qty 1

## 2018-07-10 NOTE — ED Provider Notes (Signed)
[facility] EMERGENCY DEPARTMENT Provider Note   CSN: [id number] Arrival date & time: 07/10/18  1353    History   Chief Complaint Chief Complaint  Patient presents with   Leg Pain    HPI Robin Wallace is a 68 y.o. female diabetes, gout, hypertension, high cholesterol who presents for evaluation lower back pain and left lower extremity pain that is been ongoing for last 2 weeks.  Patient reports that at the end of May 2020, she started developing some lower back pain that she states started radiating into her buttock and down the posterior inside aspect of her leg.  Patient denies any preceding trauma, injury.  She states that she will get a "sharp" pain with some "pins and tingling" on her leg.  She states that this has caused her to have pain with walking.  She states she is still able to move her leg and is still able to walk but does report worsening pain with doing so.  She has not noted any weakness of her leg.  Patient states that she was seen by her primary care doctor who prescribed her hydrocodone and muscle relaxers.  She reports that she went back to her primary care doctor few days ago who prescribed her gabapentin.  She reports that she is also been arranged to have an outpatient neurology appointment which she states is scheduled for 07/20/18.  She comes in today because she states she has had continued and persistent pain and she does not feel like she can wait until her neurology appointment. Denies fevers, weight loss, numbness/weakness of upper and lower extremities, bowel/bladder incontinence, saddle anesthesia, history of back surgery, history of IVDA.      The history is provided by the patient.    Past Medical History:  Diagnosis Date   Diabetes mellitus without complication ([place])    Gout    High cholesterol    Hypertension    Thyroid disease     Patient Active Problem List   Diagnosis Date Noted   Gout 06/14/2014   Ruptured, tendon,  nontraumatic 06/07/2014   Difficulty in walking involving ankle and foot joint 06/07/2014   Pain in lower limb 06/07/2014    Past Surgical History:  Procedure Laterality Date   ABDOMINAL HYSTERECTOMY     TONSILLECTOMY       OB History   No obstetric history on file.      Home Medications    Prior to Admission medications   Medication Sig Start Date End Date Taking? Authorizing Provider  atorvastatin (LIPITOR) 40 MG tablet  03/14/14   [provider]  azithromycin (ZITHROMAX) 250 MG tablet  03/15/14   [provider]  benzonatate (TESSALON) 100 MG capsule Take 1 capsule (100 mg total) by mouth every 8 (eight) hours. 04/27/16   Leo Grosser, MD  cephALEXin (KEFLEX) 500 MG capsule Take 1 capsule (500 mg total) by mouth 2 (two) times daily. 12/20/14   [name], [name], MD  colchicine (COLCRYS) 0.6 MG tablet Take 0.6 mg by mouth daily.    [provider]  fexofenadine (ALLEGRA) 60 MG tablet Take 1 tablet (60 mg total) by mouth 2 (two) times daily. 04/27/16 05/27/16  Leo Grosser, MD  HYDROcodone-acetaminophen (NORCO/VICODIN) 5-325 MG per tablet Take 1 tablet by mouth every 4 (four) hours as needed. 06/05/14   [name], Delice Bison, DO  HYDROcodone-homatropine (HYCODAN) 5-1.5 MG/5ML syrup Take 5 mLs by mouth every 6 (six) hours as needed for cough. 03/12/18  Malvin Johns, MD  ibuprofen (ADVIL,MOTRIN) 800 MG tablet Take 1 tablet (800 mg total) by mouth every 8 (eight) hours as needed for mild pain. 06/05/14   [name], Delice Bison, DO  levothyroxine (SYNTHROID, LEVOTHROID) 50 MCG tablet Take 50 mcg by mouth daily before breakfast.    [provider]  lidocaine (LIDODERM) 5 % Place 1 patch onto the skin daily. Remove & Discard patch within 12 hours or as directed by MD 07/10/18   Volanda Napoleon, [name]  lisinopril-hydrochlorothiazide (PRINZIDE,ZESTORETIC) 20-25 MG per tablet Take 1 tablet by mouth daily.    [provider]  metFORMIN (GLUCOPHAGE) 500 MG  tablet Take 500 mg by mouth daily with breakfast.    [provider]  mometasone (NASONEX) 50 MCG/ACT nasal spray Place 2 sprays into the nose daily. 04/27/16   Leo Grosser, MD  predniSONE (DELTASONE) 20 MG tablet 3 tabs po day one, then 2 po daily x 4 days 08/16/12   Cleatrice Burke, [name]  promethazine (PHENERGAN) 25 MG tablet Take 1 tablet (25 mg total) by mouth every 6 (six) hours as needed for nausea. 08/16/12   Cleatrice Burke, [name]  SIMVASTATIN PO Take by mouth.    [provider]    Family History No family history on file.  Social History Social History   Tobacco Use   Smoking status: Never Smoker   Smokeless tobacco: Never Used  Substance Use Topics   Alcohol use: No    Alcohol/week: 0.0 standard drinks   Drug use: No     Allergies   Patient has no known allergies.   Review of Systems Review of Systems  Constitutional: Negative for fever.  Respiratory: Negative for shortness of breath.   Gastrointestinal: Negative for nausea and vomiting.  Musculoskeletal: Positive for back pain.       LLE pain  Neurological: Negative for weakness and headaches.  All other systems reviewed and are negative.    Physical Exam Updated Vital Signs BP (!) 155/74 (BP Location: Right Arm)    Pulse 71    Temp 98.4 F (36.9 C) (Oral)    Resp 18    Ht 5' (1.524 m)    Wt 86.2 kg    SpO2 98%    BMI 37.11 kg/m   Physical Exam Vitals signs and nursing note reviewed.  Constitutional:      Appearance: She is well-developed.  HENT:     Head: Normocephalic and atraumatic.  Eyes:     General: No scleral icterus.       Right eye: No discharge.        Left eye: No discharge.     Conjunctiva/sclera: Conjunctivae normal.  Neck:     Musculoskeletal: Full passive range of motion without pain.     Comments: Full flexion/extension and lateral movement of neck fully intact. No bony midline tenderness. No deformities or crepitus.  Cardiovascular:     Pulses:           Radial pulses are 2+ on the right side and 2+ on the left side.       Dorsalis pedis pulses are 2+ on the right side and 2+ on the left side.  Pulmonary:     Effort: Pulmonary effort is normal.  Musculoskeletal:     Thoracic back: She exhibits no tenderness.       Back:     Comments: No midline T-spine tenderness.  Diffuse muscular tenderness over the left paraspinal muscles into the gluteal region and extends over  to the midline lumbar region.  No deformity or crepitus noted.  Skin:    General: Skin is warm and dry.     Comments: Good distal cap refill. LUE is not dusky in appearance or cool to touch. No overlying rash.   Neurological:     Mental Status: She is alert.     Comments: Positive SLR.  5/5 BUE  5/5 BLE but pain with LLE with movement.  Sensation intact along major nerve distributions of BLE  Psychiatric:        Speech: Speech normal.        Behavior: Behavior normal.      ED Treatments / Results  Labs (all labs ordered are listed, but only abnormal results are displayed) Labs Reviewed - No data to display  EKG None  Radiology No results found.  Procedures Procedures (including critical care time)  Medications Ordered in ED Medications  HYDROcodone-acetaminophen (NORCO/VICODIN) 5-325 MG per tablet 1 tablet (1 tablet Oral Given 07/10/18 1458)  diazepam (VALIUM) injection 5 mg (5 mg Intramuscular Given 07/10/18 1459)     Initial Impression / Assessment and Plan / ED Course  I have reviewed the triage vital signs and the nursing notes.  Pertinent labs & imaging results that were available during my care of the patient were reviewed by me and considered in my medical decision making (see chart for details).        68 year old female who presents for evaluation of left lower extremity and lower back pain x2 weeks.  Seen by PCP and prescribed hydrocodone, muscle relaxers.  Reports she was also given a outpatient referral to neurology and has appointment the  end of June.  Comes in today because she continues to have pain and does not feel like she can wait till to see neuro.  She reports she is still been able to walk but does report worsening pain with doing so.  No red flags, neuro deficits. Patient is afebrile, non-toxic appearing, sitting comfortably on examination table. Vital signs reviewed and stable.  On exam, patient has positive straight leg raise on the left.  She has full range of motion and full strength of left lower extremity but with significant pain.  Sciatica.  History/physical exam not concerning for shingles, acute spinal injury, cauda equina.  No dictation for acute MRI imaging at this time.  We will plan to give pain medications and reassess.  Patient reports that she had imaging done by her primary care doctor when she first saw him.  She reports that she had a back x-ray.  No indication for repeat imaging here in the ED. Discussed patient with Dr. Regenia Skeeter who is agreeable to plan.   Patient reports some improvement in pain.  Vital stable.  Urged follow-up with referred neurologist. At this time, patient exhibits no emergent life-threatening condition that require further evaluation in ED or admission. Patient had ample opportunity for questions and discussion. All patient's questions were answered with full understanding. Strict return precautions discussed. Patient expresses understanding and agreement to plan.   Portions of this note were generated with [employer]. Dictation errors may occur despite best attempts at proofreading.  Final Clinical Impressions(s) / ED Diagnoses   Final diagnoses:  Sciatica of left side    ED Discharge Orders         Ordered    lidocaine (LIDODERM) 5 %  Every 24 hours     07/10/18 1536  Volanda Napoleon, [name] 07/10/18 1609    Sherwood Gambler, MD 07/10/18 1705

## 2018-07-10 NOTE — ED Notes (Signed)
ED Provider at bedside. Ria Comment [place]

## 2018-07-10 NOTE — ED Triage Notes (Signed)
Pain in left buttock radiating down left leg. XR on back. Rx for Vicodin, Muscle Relaxer and Neurontin.  Has appt with Neurologist the end of June.  Pain too much for pt to tolerate so she came here.

## 2018-07-10 NOTE — Discharge Instructions (Signed)
Continue using the medications that your primary care doctor prescribed.  Additionally, you can use the Lidoderm patches as directed.  Follow-up with referred neurologist as previously scheduled.  Return to the Emergency Department immediately for any worsening back pain, neck pain, difficulty walking, numbness/weaknss of your arms or legs, urinary or bowel accidents, fever or any other worsening or concerning symptoms.

## 2018-07-10 NOTE — ED Notes (Signed)
ED Provider at bedside. 

## 2018-07-14 DIAGNOSIS — M79605 Pain in left leg: Secondary | ICD-10-CM | POA: Diagnosis not present

## 2018-07-21 DIAGNOSIS — R531 Weakness: Secondary | ICD-10-CM | POA: Diagnosis not present

## 2018-07-21 DIAGNOSIS — M5442 Lumbago with sciatica, left side: Secondary | ICD-10-CM | POA: Diagnosis not present

## 2018-07-21 DIAGNOSIS — Z7409 Other reduced mobility: Secondary | ICD-10-CM | POA: Diagnosis not present

## 2018-07-28 DIAGNOSIS — M5442 Lumbago with sciatica, left side: Secondary | ICD-10-CM | POA: Diagnosis not present

## 2018-07-28 DIAGNOSIS — M5416 Radiculopathy, lumbar region: Secondary | ICD-10-CM | POA: Diagnosis not present

## 2018-07-29 DIAGNOSIS — Z7409 Other reduced mobility: Secondary | ICD-10-CM | POA: Diagnosis not present

## 2018-07-29 DIAGNOSIS — R531 Weakness: Secondary | ICD-10-CM | POA: Diagnosis not present

## 2018-07-29 DIAGNOSIS — M5442 Lumbago with sciatica, left side: Secondary | ICD-10-CM | POA: Diagnosis not present

## 2018-08-10 DIAGNOSIS — M545 Low back pain: Secondary | ICD-10-CM | POA: Diagnosis not present

## 2018-08-10 DIAGNOSIS — M5416 Radiculopathy, lumbar region: Secondary | ICD-10-CM | POA: Diagnosis not present

## 2018-08-13 DIAGNOSIS — M5416 Radiculopathy, lumbar region: Secondary | ICD-10-CM | POA: Diagnosis not present

## 2018-08-30 ENCOUNTER — Other Ambulatory Visit: Payer: Self-pay

## 2018-08-30 DIAGNOSIS — E119 Type 2 diabetes mellitus without complications: Secondary | ICD-10-CM | POA: Diagnosis not present

## 2018-08-30 DIAGNOSIS — I10 Essential (primary) hypertension: Secondary | ICD-10-CM | POA: Diagnosis not present

## 2018-08-30 DIAGNOSIS — E559 Vitamin D deficiency, unspecified: Secondary | ICD-10-CM | POA: Diagnosis not present

## 2018-08-30 DIAGNOSIS — M109 Gout, unspecified: Secondary | ICD-10-CM | POA: Diagnosis not present

## 2018-08-30 DIAGNOSIS — E039 Hypothyroidism, unspecified: Secondary | ICD-10-CM | POA: Diagnosis not present

## 2018-08-30 DIAGNOSIS — R05 Cough: Secondary | ICD-10-CM | POA: Diagnosis not present

## 2018-08-30 DIAGNOSIS — E1140 Type 2 diabetes mellitus with diabetic neuropathy, unspecified: Secondary | ICD-10-CM | POA: Diagnosis not present

## 2018-09-01 DIAGNOSIS — M5416 Radiculopathy, lumbar region: Secondary | ICD-10-CM | POA: Diagnosis not present

## 2018-09-02 DIAGNOSIS — H04123 Dry eye syndrome of bilateral lacrimal glands: Secondary | ICD-10-CM | POA: Diagnosis not present

## 2018-09-02 DIAGNOSIS — H25813 Combined forms of age-related cataract, bilateral: Secondary | ICD-10-CM | POA: Diagnosis not present

## 2018-09-02 DIAGNOSIS — H35413 Lattice degeneration of retina, bilateral: Secondary | ICD-10-CM | POA: Diagnosis not present

## 2018-09-02 DIAGNOSIS — H5213 Myopia, bilateral: Secondary | ICD-10-CM | POA: Diagnosis not present

## 2018-09-02 DIAGNOSIS — H524 Presbyopia: Secondary | ICD-10-CM | POA: Diagnosis not present

## 2018-09-02 DIAGNOSIS — E119 Type 2 diabetes mellitus without complications: Secondary | ICD-10-CM | POA: Diagnosis not present

## 2018-09-02 DIAGNOSIS — H52223 Regular astigmatism, bilateral: Secondary | ICD-10-CM | POA: Diagnosis not present

## 2018-10-15 DIAGNOSIS — M5412 Radiculopathy, cervical region: Secondary | ICD-10-CM | POA: Diagnosis not present

## 2018-10-22 DIAGNOSIS — M109 Gout, unspecified: Secondary | ICD-10-CM | POA: Diagnosis not present

## 2018-10-22 DIAGNOSIS — E1140 Type 2 diabetes mellitus with diabetic neuropathy, unspecified: Secondary | ICD-10-CM | POA: Diagnosis not present

## 2018-10-22 DIAGNOSIS — E1165 Type 2 diabetes mellitus with hyperglycemia: Secondary | ICD-10-CM | POA: Diagnosis not present

## 2018-10-22 DIAGNOSIS — I10 Essential (primary) hypertension: Secondary | ICD-10-CM | POA: Diagnosis not present

## 2018-10-22 DIAGNOSIS — Z23 Encounter for immunization: Secondary | ICD-10-CM | POA: Diagnosis not present

## 2018-10-22 DIAGNOSIS — E039 Hypothyroidism, unspecified: Secondary | ICD-10-CM | POA: Diagnosis not present

## 2018-10-22 DIAGNOSIS — E782 Mixed hyperlipidemia: Secondary | ICD-10-CM | POA: Diagnosis not present

## 2018-10-22 DIAGNOSIS — E559 Vitamin D deficiency, unspecified: Secondary | ICD-10-CM | POA: Diagnosis not present

## 2018-12-20 ENCOUNTER — Other Ambulatory Visit: Payer: Self-pay

## 2018-12-31 DIAGNOSIS — E1140 Type 2 diabetes mellitus with diabetic neuropathy, unspecified: Secondary | ICD-10-CM | POA: Diagnosis not present

## 2018-12-31 DIAGNOSIS — E1165 Type 2 diabetes mellitus with hyperglycemia: Secondary | ICD-10-CM | POA: Diagnosis not present

## 2018-12-31 DIAGNOSIS — M109 Gout, unspecified: Secondary | ICD-10-CM | POA: Diagnosis not present

## 2018-12-31 DIAGNOSIS — E782 Mixed hyperlipidemia: Secondary | ICD-10-CM | POA: Diagnosis not present

## 2018-12-31 DIAGNOSIS — E039 Hypothyroidism, unspecified: Secondary | ICD-10-CM | POA: Diagnosis not present

## 2018-12-31 DIAGNOSIS — I10 Essential (primary) hypertension: Secondary | ICD-10-CM | POA: Diagnosis not present

## 2018-12-31 DIAGNOSIS — E559 Vitamin D deficiency, unspecified: Secondary | ICD-10-CM | POA: Diagnosis not present

## 2018-12-31 DIAGNOSIS — R05 Cough: Secondary | ICD-10-CM | POA: Diagnosis not present

## 2019-04-14 DIAGNOSIS — M5416 Radiculopathy, lumbar region: Secondary | ICD-10-CM | POA: Diagnosis not present

## 2019-04-14 DIAGNOSIS — M5412 Radiculopathy, cervical region: Secondary | ICD-10-CM | POA: Diagnosis not present

## 2019-05-12 DIAGNOSIS — M7732 Calcaneal spur, left foot: Secondary | ICD-10-CM | POA: Diagnosis not present

## 2019-05-12 DIAGNOSIS — M7731 Calcaneal spur, right foot: Secondary | ICD-10-CM | POA: Diagnosis not present

## 2019-05-12 DIAGNOSIS — M7662 Achilles tendinitis, left leg: Secondary | ICD-10-CM | POA: Diagnosis not present

## 2019-05-12 DIAGNOSIS — M722 Plantar fascial fibromatosis: Secondary | ICD-10-CM | POA: Diagnosis not present

## 2019-05-12 DIAGNOSIS — G5751 Tarsal tunnel syndrome, right lower limb: Secondary | ICD-10-CM | POA: Diagnosis not present

## 2019-05-12 DIAGNOSIS — G5752 Tarsal tunnel syndrome, left lower limb: Secondary | ICD-10-CM | POA: Diagnosis not present

## 2019-05-12 DIAGNOSIS — E1142 Type 2 diabetes mellitus with diabetic polyneuropathy: Secondary | ICD-10-CM | POA: Diagnosis not present

## 2019-05-12 DIAGNOSIS — M7661 Achilles tendinitis, right leg: Secondary | ICD-10-CM | POA: Diagnosis not present

## 2019-05-20 DIAGNOSIS — E1165 Type 2 diabetes mellitus with hyperglycemia: Secondary | ICD-10-CM | POA: Diagnosis not present

## 2019-05-20 DIAGNOSIS — E1140 Type 2 diabetes mellitus with diabetic neuropathy, unspecified: Secondary | ICD-10-CM | POA: Diagnosis not present

## 2019-05-20 DIAGNOSIS — I10 Essential (primary) hypertension: Secondary | ICD-10-CM | POA: Diagnosis not present

## 2019-05-20 DIAGNOSIS — E559 Vitamin D deficiency, unspecified: Secondary | ICD-10-CM | POA: Diagnosis not present

## 2019-05-20 DIAGNOSIS — Z0001 Encounter for general adult medical examination with abnormal findings: Secondary | ICD-10-CM | POA: Diagnosis not present

## 2019-05-20 DIAGNOSIS — E039 Hypothyroidism, unspecified: Secondary | ICD-10-CM | POA: Diagnosis not present

## 2019-05-20 DIAGNOSIS — Z1329 Encounter for screening for other suspected endocrine disorder: Secondary | ICD-10-CM | POA: Diagnosis not present

## 2019-05-20 DIAGNOSIS — M109 Gout, unspecified: Secondary | ICD-10-CM | POA: Diagnosis not present

## 2019-05-20 DIAGNOSIS — E782 Mixed hyperlipidemia: Secondary | ICD-10-CM | POA: Diagnosis not present

## 2019-08-15 DIAGNOSIS — M5412 Radiculopathy, cervical region: Secondary | ICD-10-CM | POA: Diagnosis not present

## 2019-08-15 DIAGNOSIS — M5416 Radiculopathy, lumbar region: Secondary | ICD-10-CM | POA: Diagnosis not present

## 2019-08-15 DIAGNOSIS — Z79899 Other long term (current) drug therapy: Secondary | ICD-10-CM | POA: Diagnosis not present

## 2019-08-18 DIAGNOSIS — E039 Hypothyroidism, unspecified: Secondary | ICD-10-CM | POA: Diagnosis not present

## 2019-08-18 DIAGNOSIS — E1140 Type 2 diabetes mellitus with diabetic neuropathy, unspecified: Secondary | ICD-10-CM | POA: Diagnosis not present

## 2019-08-18 DIAGNOSIS — I10 Essential (primary) hypertension: Secondary | ICD-10-CM | POA: Diagnosis not present

## 2019-08-18 DIAGNOSIS — E782 Mixed hyperlipidemia: Secondary | ICD-10-CM | POA: Diagnosis not present

## 2019-08-18 DIAGNOSIS — E1165 Type 2 diabetes mellitus with hyperglycemia: Secondary | ICD-10-CM | POA: Diagnosis not present

## 2019-08-18 DIAGNOSIS — M109 Gout, unspecified: Secondary | ICD-10-CM | POA: Diagnosis not present

## 2019-08-18 DIAGNOSIS — E559 Vitamin D deficiency, unspecified: Secondary | ICD-10-CM | POA: Diagnosis not present

## 2019-08-18 DIAGNOSIS — M549 Dorsalgia, unspecified: Secondary | ICD-10-CM | POA: Diagnosis not present

## 2019-09-13 DIAGNOSIS — Z1231 Encounter for screening mammogram for malignant neoplasm of breast: Secondary | ICD-10-CM | POA: Diagnosis not present

## 2019-09-15 DIAGNOSIS — M549 Dorsalgia, unspecified: Secondary | ICD-10-CM | POA: Diagnosis not present

## 2019-09-15 DIAGNOSIS — I10 Essential (primary) hypertension: Secondary | ICD-10-CM | POA: Diagnosis not present

## 2019-09-15 DIAGNOSIS — E559 Vitamin D deficiency, unspecified: Secondary | ICD-10-CM | POA: Diagnosis not present

## 2019-09-15 DIAGNOSIS — E1140 Type 2 diabetes mellitus with diabetic neuropathy, unspecified: Secondary | ICD-10-CM | POA: Diagnosis not present

## 2019-09-15 DIAGNOSIS — E039 Hypothyroidism, unspecified: Secondary | ICD-10-CM | POA: Diagnosis not present

## 2019-09-15 DIAGNOSIS — E1165 Type 2 diabetes mellitus with hyperglycemia: Secondary | ICD-10-CM | POA: Diagnosis not present

## 2019-09-15 DIAGNOSIS — M109 Gout, unspecified: Secondary | ICD-10-CM | POA: Diagnosis not present

## 2019-12-15 DIAGNOSIS — E1142 Type 2 diabetes mellitus with diabetic polyneuropathy: Secondary | ICD-10-CM | POA: Diagnosis not present

## 2019-12-15 DIAGNOSIS — E559 Vitamin D deficiency, unspecified: Secondary | ICD-10-CM | POA: Diagnosis not present

## 2019-12-15 DIAGNOSIS — E1165 Type 2 diabetes mellitus with hyperglycemia: Secondary | ICD-10-CM | POA: Diagnosis not present

## 2019-12-15 DIAGNOSIS — E782 Mixed hyperlipidemia: Secondary | ICD-10-CM | POA: Diagnosis not present

## 2019-12-15 DIAGNOSIS — I10 Essential (primary) hypertension: Secondary | ICD-10-CM | POA: Diagnosis not present

## 2019-12-15 DIAGNOSIS — E039 Hypothyroidism, unspecified: Secondary | ICD-10-CM | POA: Diagnosis not present

## 2019-12-15 DIAGNOSIS — M1A09X0 Idiopathic chronic gout, multiple sites, without tophus (tophi): Secondary | ICD-10-CM | POA: Diagnosis not present

## 2019-12-15 DIAGNOSIS — R7989 Other specified abnormal findings of blood chemistry: Secondary | ICD-10-CM | POA: Diagnosis not present

## 2020-03-01 DIAGNOSIS — R059 Cough, unspecified: Secondary | ICD-10-CM | POA: Diagnosis not present

## 2020-03-14 DIAGNOSIS — M5412 Radiculopathy, cervical region: Secondary | ICD-10-CM | POA: Diagnosis not present

## 2020-03-14 DIAGNOSIS — M5416 Radiculopathy, lumbar region: Secondary | ICD-10-CM | POA: Diagnosis not present

## 2020-05-25 DIAGNOSIS — E782 Mixed hyperlipidemia: Secondary | ICD-10-CM | POA: Diagnosis not present

## 2020-05-25 DIAGNOSIS — M1A09X0 Idiopathic chronic gout, multiple sites, without tophus (tophi): Secondary | ICD-10-CM | POA: Diagnosis not present

## 2020-05-25 DIAGNOSIS — E1165 Type 2 diabetes mellitus with hyperglycemia: Secondary | ICD-10-CM | POA: Diagnosis not present

## 2020-05-25 DIAGNOSIS — E039 Hypothyroidism, unspecified: Secondary | ICD-10-CM | POA: Diagnosis not present

## 2020-05-25 DIAGNOSIS — Z0001 Encounter for general adult medical examination with abnormal findings: Secondary | ICD-10-CM | POA: Diagnosis not present

## 2020-05-25 DIAGNOSIS — R7989 Other specified abnormal findings of blood chemistry: Secondary | ICD-10-CM | POA: Diagnosis not present

## 2020-05-25 DIAGNOSIS — I10 Essential (primary) hypertension: Secondary | ICD-10-CM | POA: Diagnosis not present

## 2020-05-25 DIAGNOSIS — E559 Vitamin D deficiency, unspecified: Secondary | ICD-10-CM | POA: Diagnosis not present

## 2020-05-25 DIAGNOSIS — E1142 Type 2 diabetes mellitus with diabetic polyneuropathy: Secondary | ICD-10-CM | POA: Diagnosis not present

## 2020-06-19 DIAGNOSIS — E1142 Type 2 diabetes mellitus with diabetic polyneuropathy: Secondary | ICD-10-CM | POA: Diagnosis not present

## 2020-06-19 DIAGNOSIS — M1A09X0 Idiopathic chronic gout, multiple sites, without tophus (tophi): Secondary | ICD-10-CM | POA: Diagnosis not present

## 2020-06-19 DIAGNOSIS — E559 Vitamin D deficiency, unspecified: Secondary | ICD-10-CM | POA: Diagnosis not present

## 2020-06-19 DIAGNOSIS — E1165 Type 2 diabetes mellitus with hyperglycemia: Secondary | ICD-10-CM | POA: Diagnosis not present

## 2020-06-19 DIAGNOSIS — E039 Hypothyroidism, unspecified: Secondary | ICD-10-CM | POA: Diagnosis not present

## 2020-06-19 DIAGNOSIS — R7989 Other specified abnormal findings of blood chemistry: Secondary | ICD-10-CM | POA: Diagnosis not present

## 2020-06-19 DIAGNOSIS — E782 Mixed hyperlipidemia: Secondary | ICD-10-CM | POA: Diagnosis not present

## 2020-06-19 DIAGNOSIS — I10 Essential (primary) hypertension: Secondary | ICD-10-CM | POA: Diagnosis not present

## 2020-08-14 DIAGNOSIS — R7989 Other specified abnormal findings of blood chemistry: Secondary | ICD-10-CM | POA: Diagnosis not present

## 2020-08-14 DIAGNOSIS — E1165 Type 2 diabetes mellitus with hyperglycemia: Secondary | ICD-10-CM | POA: Diagnosis not present

## 2020-08-14 DIAGNOSIS — E039 Hypothyroidism, unspecified: Secondary | ICD-10-CM | POA: Diagnosis not present

## 2020-08-14 DIAGNOSIS — E1142 Type 2 diabetes mellitus with diabetic polyneuropathy: Secondary | ICD-10-CM | POA: Diagnosis not present

## 2020-08-14 DIAGNOSIS — I10 Essential (primary) hypertension: Secondary | ICD-10-CM | POA: Diagnosis not present

## 2020-08-14 DIAGNOSIS — E782 Mixed hyperlipidemia: Secondary | ICD-10-CM | POA: Diagnosis not present

## 2020-08-14 DIAGNOSIS — M1A09X0 Idiopathic chronic gout, multiple sites, without tophus (tophi): Secondary | ICD-10-CM | POA: Diagnosis not present

## 2020-08-14 DIAGNOSIS — Z0001 Encounter for general adult medical examination with abnormal findings: Secondary | ICD-10-CM | POA: Diagnosis not present

## 2020-08-14 DIAGNOSIS — E559 Vitamin D deficiency, unspecified: Secondary | ICD-10-CM | POA: Diagnosis not present

## 2020-08-29 DIAGNOSIS — H524 Presbyopia: Secondary | ICD-10-CM | POA: Diagnosis not present

## 2020-08-29 DIAGNOSIS — H04123 Dry eye syndrome of bilateral lacrimal glands: Secondary | ICD-10-CM | POA: Diagnosis not present

## 2020-08-29 DIAGNOSIS — H52223 Regular astigmatism, bilateral: Secondary | ICD-10-CM | POA: Diagnosis not present

## 2020-08-29 DIAGNOSIS — H2513 Age-related nuclear cataract, bilateral: Secondary | ICD-10-CM | POA: Diagnosis not present

## 2020-08-29 DIAGNOSIS — E119 Type 2 diabetes mellitus without complications: Secondary | ICD-10-CM | POA: Diagnosis not present

## 2020-08-29 DIAGNOSIS — H35413 Lattice degeneration of retina, bilateral: Secondary | ICD-10-CM | POA: Diagnosis not present

## 2020-08-29 DIAGNOSIS — H5213 Myopia, bilateral: Secondary | ICD-10-CM | POA: Diagnosis not present

## 2020-10-31 IMAGING — CR DG CHEST 2V
2 series · 2 of 2 positions shown · non-contrast
Comparison: Radiographs April 27, 2016.

CLINICAL DATA: Cough, fever.

EXAM:
CHEST - 2 VIEW

[w chest pa]
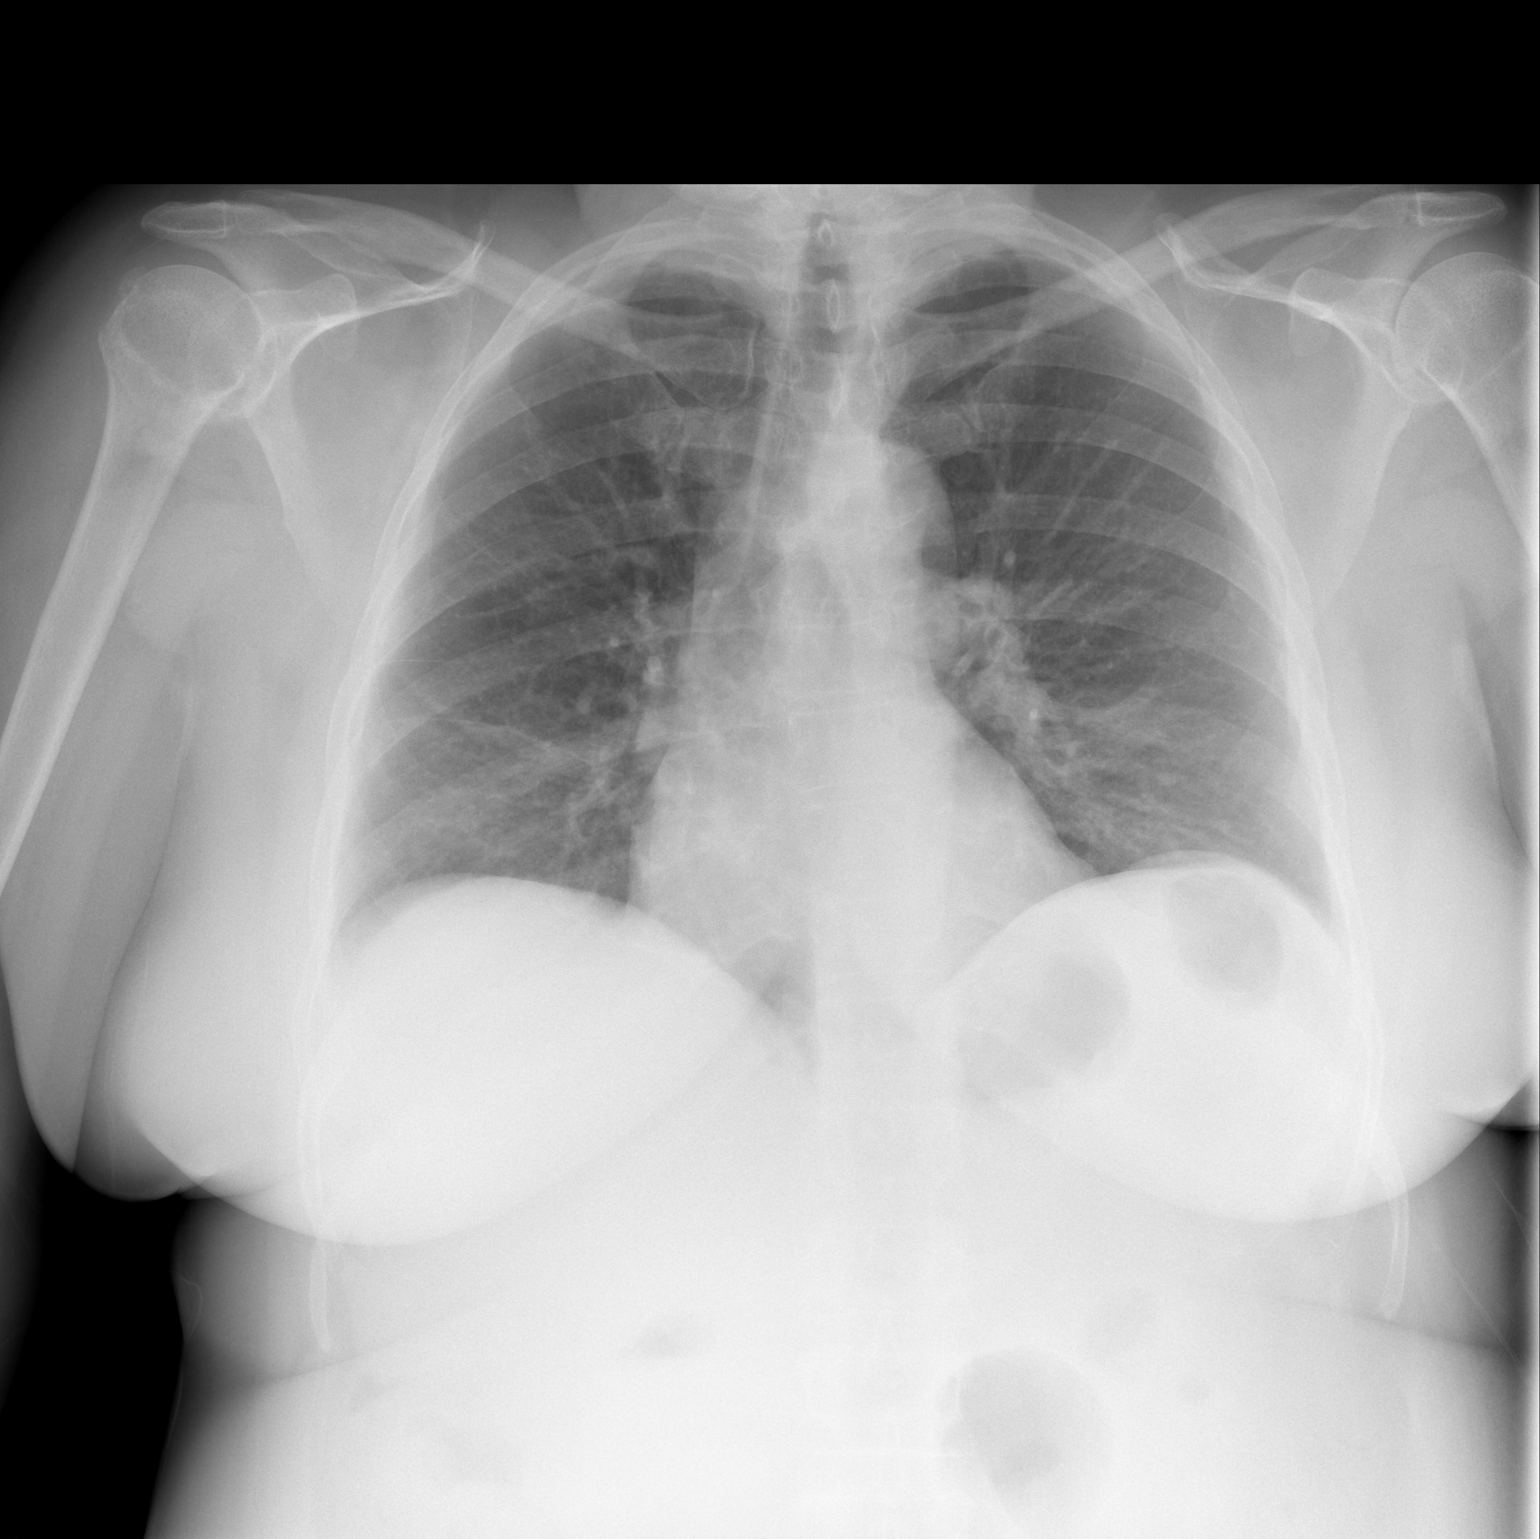

[w chest lat]
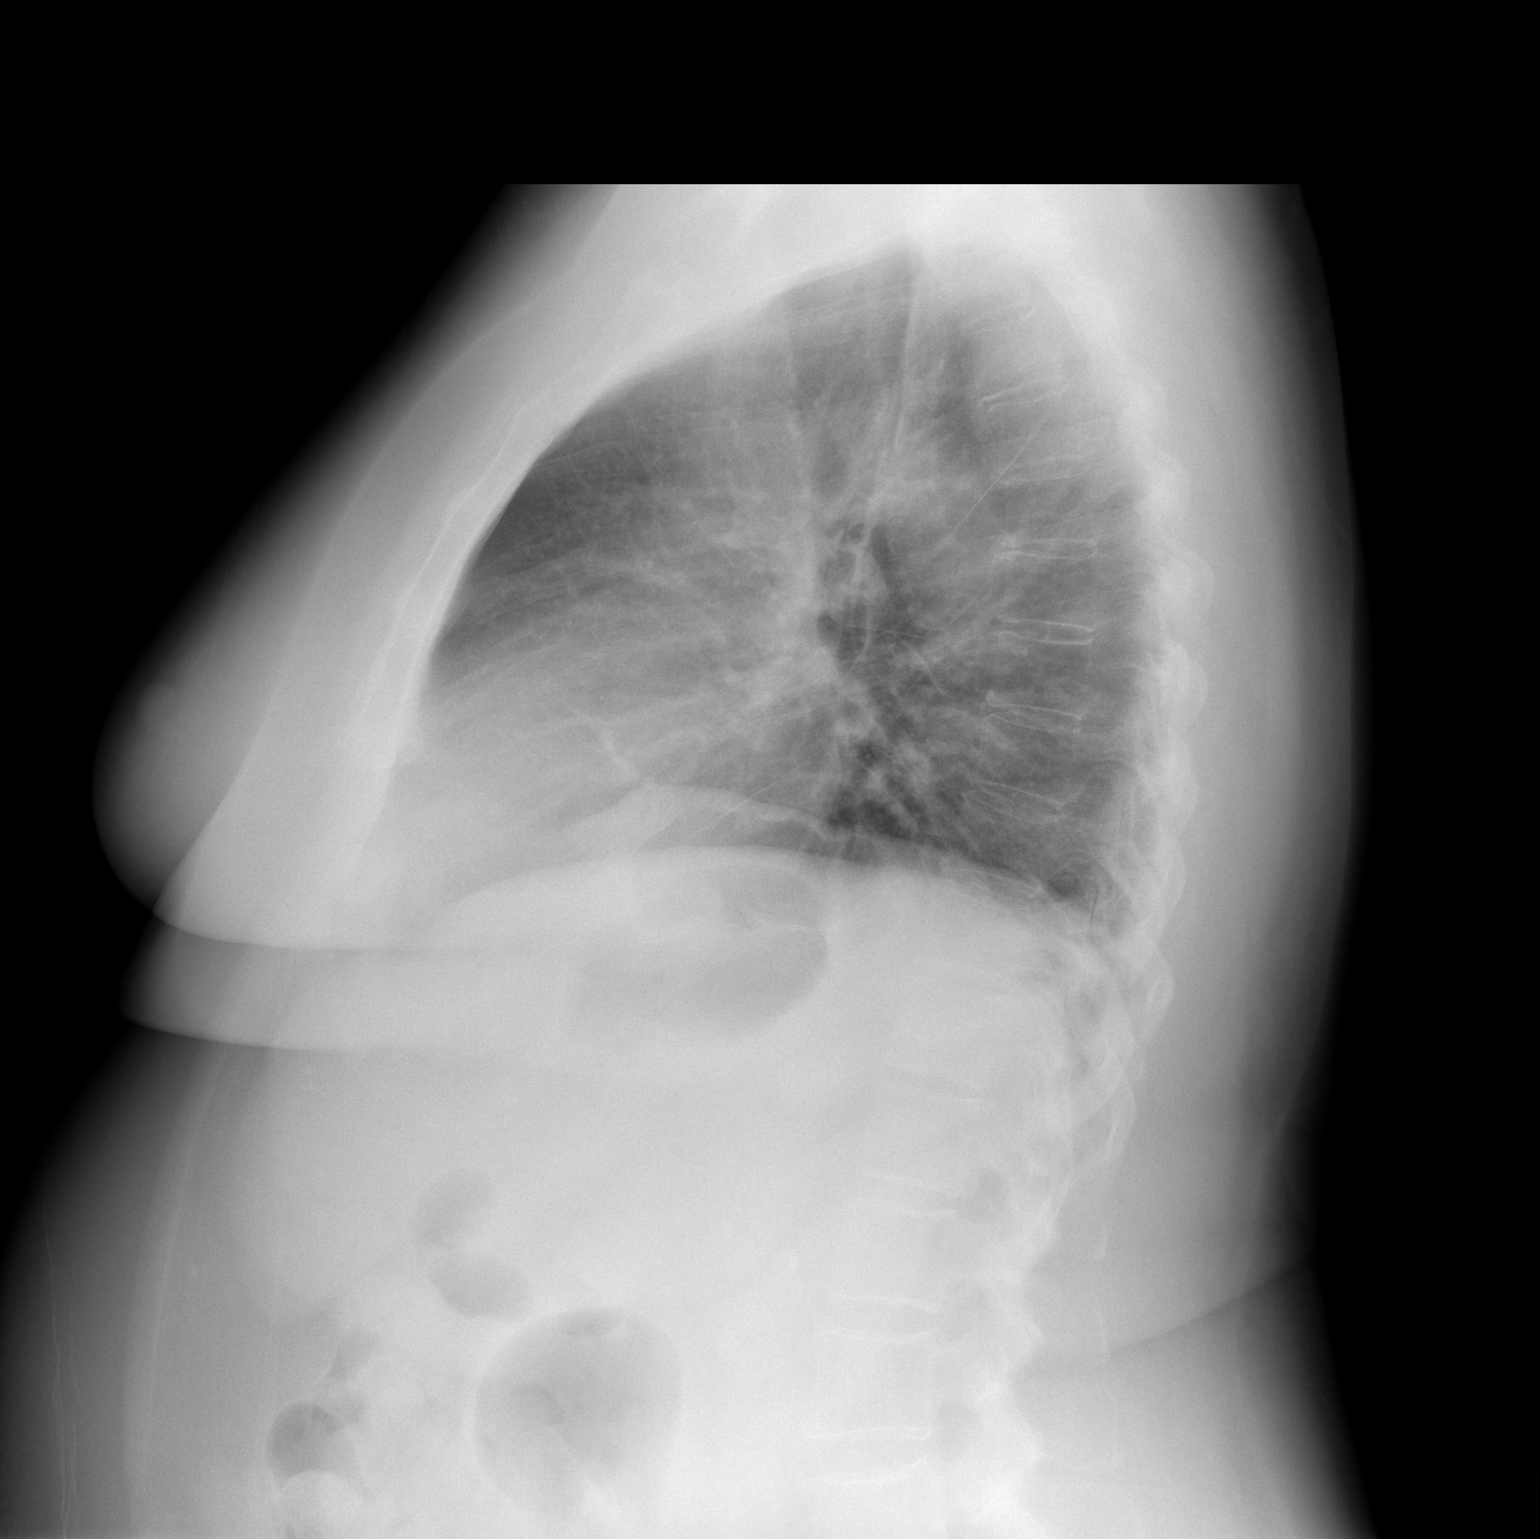

[2 of 2 positions shown; findings below may reference images not displayed]

FINDINGS: The heart size and mediastinal contours are within normal limits. No
pneumothorax or pleural effusion is noted. Stable probable scarring
seen in right middle lobe. No acute pulmonary disease is noted. The
visualized skeletal structures are unremarkable.
IMPRESSION: No active cardiopulmonary disease.

## 2020-12-06 DIAGNOSIS — Z1231 Encounter for screening mammogram for malignant neoplasm of breast: Secondary | ICD-10-CM | POA: Diagnosis not present

## 2020-12-11 DIAGNOSIS — R920 Mammographic microcalcification found on diagnostic imaging of breast: Secondary | ICD-10-CM | POA: Diagnosis not present

## 2020-12-11 DIAGNOSIS — R922 Inconclusive mammogram: Secondary | ICD-10-CM | POA: Diagnosis not present

## 2020-12-11 DIAGNOSIS — R928 Other abnormal and inconclusive findings on diagnostic imaging of breast: Secondary | ICD-10-CM | POA: Diagnosis not present

## 2021-01-04 DIAGNOSIS — D241 Benign neoplasm of right breast: Secondary | ICD-10-CM | POA: Diagnosis not present

## 2021-01-04 DIAGNOSIS — N6489 Other specified disorders of breast: Secondary | ICD-10-CM | POA: Diagnosis not present

## 2021-01-04 DIAGNOSIS — R921 Mammographic calcification found on diagnostic imaging of breast: Secondary | ICD-10-CM | POA: Diagnosis not present

## 2021-02-12 DIAGNOSIS — E1165 Type 2 diabetes mellitus with hyperglycemia: Secondary | ICD-10-CM | POA: Diagnosis not present

## 2021-02-12 DIAGNOSIS — E559 Vitamin D deficiency, unspecified: Secondary | ICD-10-CM | POA: Diagnosis not present

## 2021-02-12 DIAGNOSIS — I10 Essential (primary) hypertension: Secondary | ICD-10-CM | POA: Diagnosis not present

## 2021-02-12 DIAGNOSIS — M1A09X0 Idiopathic chronic gout, multiple sites, without tophus (tophi): Secondary | ICD-10-CM | POA: Diagnosis not present

## 2021-02-12 DIAGNOSIS — E782 Mixed hyperlipidemia: Secondary | ICD-10-CM | POA: Diagnosis not present

## 2021-02-12 DIAGNOSIS — E1142 Type 2 diabetes mellitus with diabetic polyneuropathy: Secondary | ICD-10-CM | POA: Diagnosis not present

## 2021-02-12 DIAGNOSIS — E039 Hypothyroidism, unspecified: Secondary | ICD-10-CM | POA: Diagnosis not present

## 2021-04-18 DIAGNOSIS — D120 Benign neoplasm of cecum: Secondary | ICD-10-CM | POA: Diagnosis not present

## 2021-04-18 DIAGNOSIS — D123 Benign neoplasm of transverse colon: Secondary | ICD-10-CM | POA: Diagnosis not present

## 2021-04-18 DIAGNOSIS — Z86010 Personal history of colonic polyps: Secondary | ICD-10-CM | POA: Diagnosis not present

## 2021-04-18 DIAGNOSIS — D122 Benign neoplasm of ascending colon: Secondary | ICD-10-CM | POA: Diagnosis not present

## 2021-04-23 DIAGNOSIS — D123 Benign neoplasm of transverse colon: Secondary | ICD-10-CM | POA: Diagnosis not present

## 2021-05-27 DIAGNOSIS — I10 Essential (primary) hypertension: Secondary | ICD-10-CM | POA: Diagnosis not present

## 2021-05-27 DIAGNOSIS — E1165 Type 2 diabetes mellitus with hyperglycemia: Secondary | ICD-10-CM | POA: Diagnosis not present

## 2021-05-27 DIAGNOSIS — Z0001 Encounter for general adult medical examination with abnormal findings: Secondary | ICD-10-CM | POA: Diagnosis not present

## 2021-05-27 DIAGNOSIS — E782 Mixed hyperlipidemia: Secondary | ICD-10-CM | POA: Diagnosis not present

## 2021-06-14 ENCOUNTER — Other Ambulatory Visit: Payer: Self-pay

## 2021-06-14 ENCOUNTER — Other Ambulatory Visit (HOSPITAL_BASED_OUTPATIENT_CLINIC_OR_DEPARTMENT_OTHER): Payer: Self-pay

## 2021-06-14 ENCOUNTER — Encounter (HOSPITAL_BASED_OUTPATIENT_CLINIC_OR_DEPARTMENT_OTHER): Payer: Self-pay | Admitting: Emergency Medicine

## 2021-06-14 ENCOUNTER — Emergency Department (HOSPITAL_BASED_OUTPATIENT_CLINIC_OR_DEPARTMENT_OTHER)
Admission: EM | Admit: 2021-06-14 | Discharge: 2021-06-14 | Disposition: A | Discharge status: Home / Self Care | Payer: PPO | Attending: Emergency Medicine | Admitting: Emergency Medicine

## 2021-06-14 DIAGNOSIS — Z79899 Other long term (current) drug therapy: Secondary | ICD-10-CM | POA: Insufficient documentation

## 2021-06-14 DIAGNOSIS — R059 Cough, unspecified: Secondary | ICD-10-CM | POA: Diagnosis present

## 2021-06-14 DIAGNOSIS — U071 COVID-19: Secondary | ICD-10-CM | POA: Diagnosis not present

## 2021-06-14 MED ORDER — ONDANSETRON 4 MG PO TBDP
ORAL_TABLET | ORAL | 0 refills | Status: AC
  Filled 2021-06-14: qty 8, 2d supply, fill #0

## 2021-06-14 MED ORDER — MOLNUPIRAVIR EUA 200MG CAPSULE
4.0000 | ORAL_CAPSULE | Freq: Two times a day (BID) | ORAL | 0 refills | Status: AC
  Filled 2021-06-14: qty 40, 5d supply, fill #0

## 2021-06-14 MED ORDER — BENZONATATE 100 MG PO CAPS
100.0000 mg | ORAL_CAPSULE | Freq: Three times a day (TID) | ORAL | 0 refills | Status: AC
  Filled 2021-06-14: qty 21, 7d supply, fill #0

## 2021-06-14 NOTE — Discharge Instructions (Signed)
Take tylenol 2 pills 4 times a day and motrin 4 pills 3 times a day.  Drink plenty of fluids.  Return for worsening shortness of breath, headache, confusion. Follow up with your family doctor.   

## 2021-06-14 NOTE — ED Triage Notes (Addendum)
Cough since Tuesday took home test and it was positive for covid  , daughter had it, had to sit up to breath and hurts in upper abd to breath and cough

## 2021-06-14 NOTE — ED Provider Notes (Signed)
[facility] EMERGENCY DEPARTMENT Provider Note   CSN: [id number] Arrival date & time: 06/14/21  7782     History  Chief Complaint  Patient presents with   Cough    Robin Wallace is a 71 y.o. female.  71 yo F with a cc of cough and fever.  This been going on for couple days now.  She was in close contact with her daughter who is recently positive for COVID.  She took a home test this morning and it was positive and so came here for the oral medication to treat COVID.  She has had a mild decreased appetite.  Has upper abdominal discomfort with coughing.   Cough     Home Medications Prior to Admission medications   Medication Sig Start Date End Date Taking? Authorizing Provider  benzonatate (TESSALON) 100 MG capsule Take 1 capsule (100 mg total) by mouth every 8 (eight) hours. 06/14/21  Yes Deno Etienne, DO  molnupiravir EUA (LAGEVRIO) 200 mg CAPS capsule Take 4 capsules (800 mg total) by mouth 2 (two) times daily for 5 days. 06/14/21 06/19/21 Yes Deno Etienne, DO  ondansetron (ZOFRAN-ODT) 4 MG disintegrating tablet Dissolve 1 tablet under the tongue every 4 hours as needed for nausea/vomitting 06/14/21  Yes Deno Etienne, DO  atorvastatin (LIPITOR) 40 MG tablet  03/14/14   [provider]  azithromycin (ZITHROMAX) 250 MG tablet  03/15/14   [provider]  cephALEXin (KEFLEX) 500 MG capsule Take 1 capsule (500 mg total) by mouth 2 (two) times daily. 12/20/14   [name], [name], MD  colchicine (COLCRYS) 0.6 MG tablet Take 0.6 mg by mouth daily.    [provider]  fexofenadine (ALLEGRA) 60 MG tablet Take 1 tablet (60 mg total) by mouth 2 (two) times daily. 04/27/16 05/27/16  Leo Grosser, MD  HYDROcodone-acetaminophen (NORCO/VICODIN) 5-325 MG per tablet Take 1 tablet by mouth every 4 (four) hours as needed. 06/05/14   [name], Delice Bison, DO  HYDROcodone-homatropine (HYCODAN) 5-1.5 MG/5ML syrup Take 5 mLs by mouth every 6 (six) hours as needed for cough. 03/12/18    Malvin Johns, MD  ibuprofen (ADVIL,MOTRIN) 800 MG tablet Take 1 tablet (800 mg total) by mouth every 8 (eight) hours as needed for mild pain. 06/05/14   [name], Delice Bison, DO  levothyroxine (SYNTHROID, LEVOTHROID) 50 MCG tablet Take 50 mcg by mouth daily before breakfast.    [provider]  lidocaine (LIDODERM) 5 % Place 1 patch onto the skin daily. Remove & Discard patch within 12 hours or as directed by MD 07/10/18   Volanda Napoleon, [name]  lisinopril-hydrochlorothiazide (PRINZIDE,ZESTORETIC) 20-25 MG per tablet Take 1 tablet by mouth daily.    [provider]  metFORMIN (GLUCOPHAGE) 500 MG tablet Take 500 mg by mouth daily with breakfast.    [provider]  mometasone (NASONEX) 50 MCG/ACT nasal spray Place 2 sprays into the nose daily. 04/27/16   Leo Grosser, MD  predniSONE (DELTASONE) 20 MG tablet 3 tabs po day one, then 2 po daily x 4 days 08/16/12   Cleatrice Burke, [name]  promethazine (PHENERGAN) 25 MG tablet Take 1 tablet (25 mg total) by mouth every 6 (six) hours as needed for nausea. 08/16/12   Cleatrice Burke, [name]  SIMVASTATIN PO Take by mouth.    [provider]      Allergies    Patient has no known allergies.    Review of Systems   Review of Systems  Respiratory:  Positive for cough.  Physical Exam Updated Vital Signs BP 131/73 (BP Location: Right Arm)   Pulse 95   Temp 98.3 F (36.8 C) (Oral)   Resp 15   Ht 5' (1.524 m)   Wt 86.2 kg   SpO2 100%   BMI 37.11 kg/m  Physical Exam Vitals and nursing note reviewed.  Constitutional:      General: She is not in acute distress.    Appearance: She is well-developed. She is not diaphoretic.  HENT:     Head: Normocephalic and atraumatic.  Eyes:     Pupils: Pupils are equal, round, and reactive to light.  Cardiovascular:     Rate and Rhythm: Normal rate and regular rhythm.     Heart sounds: No murmur heard.   No friction rub. No gallop.  Pulmonary:     Effort: Pulmonary effort is  normal.     Breath sounds: No wheezing or rales.  Abdominal:     General: There is no distension.     Palpations: Abdomen is soft.     Tenderness: There is no abdominal tenderness.  Musculoskeletal:        General: No tenderness.     Cervical back: Normal range of motion and neck supple.  Skin:    General: Skin is warm and dry.  Neurological:     Mental Status: She is alert and oriented to person, place, and time.  Psychiatric:        Behavior: Behavior normal.    ED Results / Procedures / Treatments   Labs (all labs ordered are listed, but only abnormal results are displayed) Labs Reviewed - No data to display  EKG None  Radiology No results found.  Procedures Procedures    Medications Ordered in ED Medications - No data to display  ED Course/ Medical Decision Making/ A&P                           Medical Decision Making Risk Prescription drug management.   71 yo F with a chief complaints of COVID-19 infection.  She is well-appearing nontoxic.  Is clear lung sounds.  Appears well-hydrated.  No abdominal tenderness on exam.  I discussed with her at length the risk and benefits of oral therapy for COVID.  She is electing to try the medication at home.  With no recent lab work documenting her renal function for at least 4 years and her being on a statin will prescribe molnupiravir.  PCP follow-up.  9:46 AM:  I have discussed the diagnosis/risks/treatment options with the patient.  Evaluation and diagnostic testing in the emergency department does not suggest an emergent condition requiring admission or immediate intervention beyond what has been performed at this time.  They will follow up with  PCP. We also discussed returning to the ED immediately if new or worsening sx occur. We discussed the sx which are most concerning (e.g., sudden worsening pain, fever, inability to tolerate by mouth, sob) that necessitate immediate return. Medications administered to the patient  during their visit and any new prescriptions provided to the patient are listed below.  Medications given during this visit Medications - No data to display   The patient appears reasonably screen and/or stabilized for discharge and I doubt any other medical condition or other [facility] requiring further screening, evaluation, or treatment in the ED at this time prior to discharge.          Final Clinical Impression(s) / ED Diagnoses Final  diagnoses:  COVID-19 virus infection    Rx / DC Orders ED Discharge Orders          Ordered    molnupiravir EUA (LAGEVRIO) 200 mg CAPS capsule  2 times daily        06/14/21 0941    ondansetron (ZOFRAN-ODT) 4 MG disintegrating tablet        06/14/21 0941    benzonatate (TESSALON) 100 MG capsule  Every 8 hours        06/14/21 [place], [place], DO 06/14/21 [phone]

## 2021-08-14 DIAGNOSIS — E782 Mixed hyperlipidemia: Secondary | ICD-10-CM | POA: Diagnosis not present

## 2021-08-14 DIAGNOSIS — I10 Essential (primary) hypertension: Secondary | ICD-10-CM | POA: Diagnosis not present

## 2021-08-14 DIAGNOSIS — E1165 Type 2 diabetes mellitus with hyperglycemia: Secondary | ICD-10-CM | POA: Diagnosis not present

## 2021-08-14 DIAGNOSIS — E669 Obesity, unspecified: Secondary | ICD-10-CM | POA: Diagnosis not present

## 2021-08-19 ENCOUNTER — Other Ambulatory Visit (HOSPITAL_COMMUNITY): Payer: Self-pay

## 2021-11-13 DIAGNOSIS — G4709 Other insomnia: Secondary | ICD-10-CM | POA: Diagnosis not present

## 2021-11-13 DIAGNOSIS — E669 Obesity, unspecified: Secondary | ICD-10-CM | POA: Diagnosis not present

## 2021-11-13 DIAGNOSIS — Z23 Encounter for immunization: Secondary | ICD-10-CM | POA: Diagnosis not present

## 2021-11-13 DIAGNOSIS — E1165 Type 2 diabetes mellitus with hyperglycemia: Secondary | ICD-10-CM | POA: Diagnosis not present

## 2021-11-13 DIAGNOSIS — I10 Essential (primary) hypertension: Secondary | ICD-10-CM | POA: Diagnosis not present

## 2021-11-13 DIAGNOSIS — E782 Mixed hyperlipidemia: Secondary | ICD-10-CM | POA: Diagnosis not present

## 2022-01-06 DIAGNOSIS — M5416 Radiculopathy, lumbar region: Secondary | ICD-10-CM | POA: Diagnosis not present

## 2022-01-10 DIAGNOSIS — M5416 Radiculopathy, lumbar region: Secondary | ICD-10-CM | POA: Diagnosis not present

## 2022-01-28 ENCOUNTER — Other Ambulatory Visit (HOSPITAL_COMMUNITY): Payer: Self-pay

## 2022-03-05 DIAGNOSIS — R053 Chronic cough: Secondary | ICD-10-CM | POA: Diagnosis not present

## 2022-03-21 DIAGNOSIS — Z1231 Encounter for screening mammogram for malignant neoplasm of breast: Secondary | ICD-10-CM | POA: Diagnosis not present

## 2022-04-07 DIAGNOSIS — M47816 Spondylosis without myelopathy or radiculopathy, lumbar region: Secondary | ICD-10-CM | POA: Diagnosis not present

## 2022-04-17 DIAGNOSIS — M47816 Spondylosis without myelopathy or radiculopathy, lumbar region: Secondary | ICD-10-CM | POA: Diagnosis not present

## 2022-04-24 DIAGNOSIS — M47816 Spondylosis without myelopathy or radiculopathy, lumbar region: Secondary | ICD-10-CM | POA: Diagnosis not present

## 2022-05-20 DIAGNOSIS — M47816 Spondylosis without myelopathy or radiculopathy, lumbar region: Secondary | ICD-10-CM | POA: Diagnosis not present

## 2022-05-29 DIAGNOSIS — Z0001 Encounter for general adult medical examination with abnormal findings: Secondary | ICD-10-CM | POA: Diagnosis not present

## 2022-05-29 DIAGNOSIS — M25511 Pain in right shoulder: Secondary | ICD-10-CM | POA: Diagnosis not present

## 2022-05-29 DIAGNOSIS — E1165 Type 2 diabetes mellitus with hyperglycemia: Secondary | ICD-10-CM | POA: Diagnosis not present

## 2022-05-29 DIAGNOSIS — E669 Obesity, unspecified: Secondary | ICD-10-CM | POA: Diagnosis not present

## 2022-05-29 DIAGNOSIS — I10 Essential (primary) hypertension: Secondary | ICD-10-CM | POA: Diagnosis not present

## 2022-05-29 DIAGNOSIS — E782 Mixed hyperlipidemia: Secondary | ICD-10-CM | POA: Diagnosis not present

## 2022-07-01 DIAGNOSIS — R052 Subacute cough: Secondary | ICD-10-CM | POA: Diagnosis not present

## 2022-07-01 DIAGNOSIS — J189 Pneumonia, unspecified organism: Secondary | ICD-10-CM | POA: Diagnosis not present

## 2022-07-28 ENCOUNTER — Other Ambulatory Visit (HOSPITAL_BASED_OUTPATIENT_CLINIC_OR_DEPARTMENT_OTHER): Payer: Self-pay | Admitting: Internal Medicine

## 2022-07-28 DIAGNOSIS — R053 Chronic cough: Secondary | ICD-10-CM | POA: Diagnosis not present

## 2022-07-29 ENCOUNTER — Telehealth (HOSPITAL_BASED_OUTPATIENT_CLINIC_OR_DEPARTMENT_OTHER): Payer: Self-pay

## 2022-08-18 ENCOUNTER — Ambulatory Visit (HOSPITAL_BASED_OUTPATIENT_CLINIC_OR_DEPARTMENT_OTHER)
Admission: RE | Admit: 2022-08-18 | Discharge: 2022-08-18 | Disposition: A | Discharge status: Home / Self Care | Payer: PPO | Source: Ambulatory Visit | Attending: Internal Medicine | Admitting: Internal Medicine

## 2022-08-18 DIAGNOSIS — R911 Solitary pulmonary nodule: Secondary | ICD-10-CM | POA: Diagnosis not present

## 2022-08-18 DIAGNOSIS — R053 Chronic cough: Secondary | ICD-10-CM | POA: Insufficient documentation

## 2022-08-18 DIAGNOSIS — J9811 Atelectasis: Secondary | ICD-10-CM | POA: Diagnosis not present

## 2022-09-24 DIAGNOSIS — E782 Mixed hyperlipidemia: Secondary | ICD-10-CM | POA: Diagnosis not present

## 2022-09-24 DIAGNOSIS — R140 Abdominal distension (gaseous): Secondary | ICD-10-CM | POA: Diagnosis not present

## 2022-09-24 DIAGNOSIS — E1165 Type 2 diabetes mellitus with hyperglycemia: Secondary | ICD-10-CM | POA: Diagnosis not present

## 2022-09-24 DIAGNOSIS — I10 Essential (primary) hypertension: Secondary | ICD-10-CM | POA: Diagnosis not present

## 2022-09-24 DIAGNOSIS — Z0000 Encounter for general adult medical examination without abnormal findings: Secondary | ICD-10-CM | POA: Diagnosis not present

## 2022-09-24 DIAGNOSIS — E669 Obesity, unspecified: Secondary | ICD-10-CM | POA: Diagnosis not present

## 2022-09-24 DIAGNOSIS — R053 Chronic cough: Secondary | ICD-10-CM | POA: Diagnosis not present

## 2022-11-13 DIAGNOSIS — K760 Fatty (change of) liver, not elsewhere classified: Secondary | ICD-10-CM | POA: Diagnosis not present

## 2022-11-13 DIAGNOSIS — D509 Iron deficiency anemia, unspecified: Secondary | ICD-10-CM | POA: Diagnosis not present

## 2022-11-13 DIAGNOSIS — R9389 Abnormal findings on diagnostic imaging of other specified body structures: Secondary | ICD-10-CM | POA: Diagnosis not present

## 2022-11-13 DIAGNOSIS — E782 Mixed hyperlipidemia: Secondary | ICD-10-CM | POA: Diagnosis not present

## 2022-11-13 DIAGNOSIS — E1165 Type 2 diabetes mellitus with hyperglycemia: Secondary | ICD-10-CM | POA: Diagnosis not present

## 2022-11-13 DIAGNOSIS — E669 Obesity, unspecified: Secondary | ICD-10-CM | POA: Diagnosis not present

## 2022-11-13 DIAGNOSIS — I10 Essential (primary) hypertension: Secondary | ICD-10-CM | POA: Diagnosis not present

## 2022-11-13 DIAGNOSIS — R053 Chronic cough: Secondary | ICD-10-CM | POA: Diagnosis not present

## 2022-11-14 ENCOUNTER — Encounter (HOSPITAL_BASED_OUTPATIENT_CLINIC_OR_DEPARTMENT_OTHER): Payer: Self-pay | Admitting: Physician Assistant

## 2022-11-14 ENCOUNTER — Other Ambulatory Visit (HOSPITAL_BASED_OUTPATIENT_CLINIC_OR_DEPARTMENT_OTHER): Payer: Self-pay | Admitting: Physician Assistant

## 2022-11-14 DIAGNOSIS — D509 Iron deficiency anemia, unspecified: Secondary | ICD-10-CM | POA: Diagnosis not present

## 2022-11-14 DIAGNOSIS — E1165 Type 2 diabetes mellitus with hyperglycemia: Secondary | ICD-10-CM | POA: Diagnosis not present

## 2022-11-14 DIAGNOSIS — E669 Obesity, unspecified: Secondary | ICD-10-CM | POA: Diagnosis not present

## 2022-11-14 DIAGNOSIS — R053 Chronic cough: Secondary | ICD-10-CM | POA: Diagnosis not present

## 2022-11-14 DIAGNOSIS — I10 Essential (primary) hypertension: Secondary | ICD-10-CM | POA: Diagnosis not present

## 2022-11-14 DIAGNOSIS — E782 Mixed hyperlipidemia: Secondary | ICD-10-CM | POA: Diagnosis not present

## 2022-11-14 DIAGNOSIS — R9389 Abnormal findings on diagnostic imaging of other specified body structures: Secondary | ICD-10-CM

## 2022-11-18 ENCOUNTER — Telehealth (HOSPITAL_BASED_OUTPATIENT_CLINIC_OR_DEPARTMENT_OTHER): Payer: Self-pay

## 2022-11-18 DIAGNOSIS — D509 Iron deficiency anemia, unspecified: Secondary | ICD-10-CM | POA: Diagnosis not present

## 2022-11-18 DIAGNOSIS — E538 Deficiency of other specified B group vitamins: Secondary | ICD-10-CM | POA: Diagnosis not present

## 2022-11-19 DIAGNOSIS — I890 Lymphedema, not elsewhere classified: Secondary | ICD-10-CM | POA: Diagnosis not present

## 2022-11-19 DIAGNOSIS — E538 Deficiency of other specified B group vitamins: Secondary | ICD-10-CM | POA: Diagnosis not present

## 2022-11-19 DIAGNOSIS — E611 Iron deficiency: Secondary | ICD-10-CM | POA: Diagnosis not present

## 2022-11-21 DIAGNOSIS — I10 Essential (primary) hypertension: Secondary | ICD-10-CM | POA: Diagnosis not present

## 2022-11-21 DIAGNOSIS — D509 Iron deficiency anemia, unspecified: Secondary | ICD-10-CM | POA: Diagnosis not present

## 2022-11-21 DIAGNOSIS — R053 Chronic cough: Secondary | ICD-10-CM | POA: Diagnosis not present

## 2022-11-21 DIAGNOSIS — R9389 Abnormal findings on diagnostic imaging of other specified body structures: Secondary | ICD-10-CM | POA: Diagnosis not present

## 2022-11-21 DIAGNOSIS — E782 Mixed hyperlipidemia: Secondary | ICD-10-CM | POA: Diagnosis not present

## 2022-11-21 DIAGNOSIS — E669 Obesity, unspecified: Secondary | ICD-10-CM | POA: Diagnosis not present

## 2022-11-21 DIAGNOSIS — K760 Fatty (change of) liver, not elsewhere classified: Secondary | ICD-10-CM | POA: Diagnosis not present

## 2022-11-21 DIAGNOSIS — E1165 Type 2 diabetes mellitus with hyperglycemia: Secondary | ICD-10-CM | POA: Diagnosis not present

## 2022-11-25 DIAGNOSIS — D509 Iron deficiency anemia, unspecified: Secondary | ICD-10-CM | POA: Diagnosis not present

## 2022-11-25 DIAGNOSIS — R634 Abnormal weight loss: Secondary | ICD-10-CM | POA: Diagnosis not present

## 2022-11-28 DIAGNOSIS — E611 Iron deficiency: Secondary | ICD-10-CM | POA: Diagnosis not present

## 2022-11-28 DIAGNOSIS — E538 Deficiency of other specified B group vitamins: Secondary | ICD-10-CM | POA: Diagnosis not present

## 2022-12-04 ENCOUNTER — Telehealth (HOSPITAL_BASED_OUTPATIENT_CLINIC_OR_DEPARTMENT_OTHER): Payer: Self-pay

## 2022-12-10 DIAGNOSIS — K648 Other hemorrhoids: Secondary | ICD-10-CM | POA: Diagnosis not present

## 2022-12-10 DIAGNOSIS — D509 Iron deficiency anemia, unspecified: Secondary | ICD-10-CM | POA: Diagnosis not present

## 2022-12-10 DIAGNOSIS — K3189 Other diseases of stomach and duodenum: Secondary | ICD-10-CM | POA: Diagnosis not present

## 2022-12-10 DIAGNOSIS — R634 Abnormal weight loss: Secondary | ICD-10-CM | POA: Diagnosis not present

## 2022-12-10 DIAGNOSIS — K2950 Unspecified chronic gastritis without bleeding: Secondary | ICD-10-CM | POA: Diagnosis not present

## 2022-12-10 DIAGNOSIS — I8500 Esophageal varices without bleeding: Secondary | ICD-10-CM | POA: Diagnosis not present

## 2022-12-10 DIAGNOSIS — D123 Benign neoplasm of transverse colon: Secondary | ICD-10-CM | POA: Diagnosis not present

## 2022-12-15 DIAGNOSIS — R053 Chronic cough: Secondary | ICD-10-CM | POA: Diagnosis not present

## 2022-12-15 DIAGNOSIS — R059 Cough, unspecified: Secondary | ICD-10-CM | POA: Diagnosis not present

## 2022-12-30 ENCOUNTER — Telehealth (HOSPITAL_BASED_OUTPATIENT_CLINIC_OR_DEPARTMENT_OTHER): Payer: Self-pay

## 2023-01-02 DIAGNOSIS — D509 Iron deficiency anemia, unspecified: Secondary | ICD-10-CM | POA: Diagnosis not present

## 2023-01-02 DIAGNOSIS — R9389 Abnormal findings on diagnostic imaging of other specified body structures: Secondary | ICD-10-CM | POA: Diagnosis not present

## 2023-01-02 DIAGNOSIS — E669 Obesity, unspecified: Secondary | ICD-10-CM | POA: Diagnosis not present

## 2023-01-02 DIAGNOSIS — E1165 Type 2 diabetes mellitus with hyperglycemia: Secondary | ICD-10-CM | POA: Diagnosis not present

## 2023-01-02 DIAGNOSIS — I10 Essential (primary) hypertension: Secondary | ICD-10-CM | POA: Diagnosis not present

## 2023-01-02 DIAGNOSIS — K760 Fatty (change of) liver, not elsewhere classified: Secondary | ICD-10-CM | POA: Diagnosis not present

## 2023-01-02 DIAGNOSIS — D508 Other iron deficiency anemias: Secondary | ICD-10-CM | POA: Diagnosis not present

## 2023-01-02 DIAGNOSIS — E782 Mixed hyperlipidemia: Secondary | ICD-10-CM | POA: Diagnosis not present

## 2023-01-16 DIAGNOSIS — E782 Mixed hyperlipidemia: Secondary | ICD-10-CM | POA: Diagnosis not present

## 2023-01-16 DIAGNOSIS — K760 Fatty (change of) liver, not elsewhere classified: Secondary | ICD-10-CM | POA: Diagnosis not present

## 2023-01-16 DIAGNOSIS — N3001 Acute cystitis with hematuria: Secondary | ICD-10-CM | POA: Diagnosis not present

## 2023-01-16 DIAGNOSIS — E669 Obesity, unspecified: Secondary | ICD-10-CM | POA: Diagnosis not present

## 2023-01-16 DIAGNOSIS — D509 Iron deficiency anemia, unspecified: Secondary | ICD-10-CM | POA: Diagnosis not present

## 2023-01-16 DIAGNOSIS — E1165 Type 2 diabetes mellitus with hyperglycemia: Secondary | ICD-10-CM | POA: Diagnosis not present

## 2023-01-16 DIAGNOSIS — I10 Essential (primary) hypertension: Secondary | ICD-10-CM | POA: Diagnosis not present

## 2023-01-16 DIAGNOSIS — E538 Deficiency of other specified B group vitamins: Secondary | ICD-10-CM | POA: Diagnosis not present

## 2023-02-19 DIAGNOSIS — E538 Deficiency of other specified B group vitamins: Secondary | ICD-10-CM | POA: Diagnosis not present

## 2023-02-19 DIAGNOSIS — D509 Iron deficiency anemia, unspecified: Secondary | ICD-10-CM | POA: Diagnosis not present

## 2023-02-19 DIAGNOSIS — I8500 Esophageal varices without bleeding: Secondary | ICD-10-CM | POA: Diagnosis not present

## 2023-03-23 DIAGNOSIS — K219 Gastro-esophageal reflux disease without esophagitis: Secondary | ICD-10-CM | POA: Diagnosis not present

## 2023-03-23 DIAGNOSIS — R053 Chronic cough: Secondary | ICD-10-CM | POA: Diagnosis not present

## 2023-03-23 DIAGNOSIS — K449 Diaphragmatic hernia without obstruction or gangrene: Secondary | ICD-10-CM | POA: Diagnosis not present

## 2023-04-21 ENCOUNTER — Telehealth (HOSPITAL_BASED_OUTPATIENT_CLINIC_OR_DEPARTMENT_OTHER): Payer: Self-pay

## 2023-04-21 DIAGNOSIS — E669 Obesity, unspecified: Secondary | ICD-10-CM | POA: Diagnosis not present

## 2023-04-21 DIAGNOSIS — E782 Mixed hyperlipidemia: Secondary | ICD-10-CM | POA: Diagnosis not present

## 2023-04-21 DIAGNOSIS — R053 Chronic cough: Secondary | ICD-10-CM | POA: Diagnosis not present

## 2023-04-21 DIAGNOSIS — D509 Iron deficiency anemia, unspecified: Secondary | ICD-10-CM | POA: Diagnosis not present

## 2023-04-21 DIAGNOSIS — I10 Essential (primary) hypertension: Secondary | ICD-10-CM | POA: Diagnosis not present

## 2023-04-21 DIAGNOSIS — E1165 Type 2 diabetes mellitus with hyperglycemia: Secondary | ICD-10-CM | POA: Diagnosis not present

## 2023-04-21 DIAGNOSIS — K760 Fatty (change of) liver, not elsewhere classified: Secondary | ICD-10-CM | POA: Diagnosis not present

## 2023-04-24 ENCOUNTER — Other Ambulatory Visit (HOSPITAL_BASED_OUTPATIENT_CLINIC_OR_DEPARTMENT_OTHER): Payer: Self-pay | Admitting: Physician Assistant

## 2023-04-24 ENCOUNTER — Ambulatory Visit (HOSPITAL_BASED_OUTPATIENT_CLINIC_OR_DEPARTMENT_OTHER)
Admission: RE | Admit: 2023-04-24 | Discharge: 2023-04-24 | Disposition: A | Discharge status: Home / Self Care | Source: Ambulatory Visit | Attending: Physician Assistant | Admitting: Physician Assistant

## 2023-04-24 DIAGNOSIS — K760 Fatty (change of) liver, not elsewhere classified: Secondary | ICD-10-CM

## 2023-04-24 DIAGNOSIS — K7460 Unspecified cirrhosis of liver: Secondary | ICD-10-CM | POA: Diagnosis not present

## 2023-04-24 DIAGNOSIS — R9389 Abnormal findings on diagnostic imaging of other specified body structures: Secondary | ICD-10-CM | POA: Diagnosis not present

## 2023-04-24 DIAGNOSIS — R188 Other ascites: Secondary | ICD-10-CM | POA: Diagnosis not present

## 2023-04-28 DIAGNOSIS — D509 Iron deficiency anemia, unspecified: Secondary | ICD-10-CM | POA: Diagnosis not present

## 2023-04-28 DIAGNOSIS — I10 Essential (primary) hypertension: Secondary | ICD-10-CM | POA: Diagnosis not present

## 2023-04-28 DIAGNOSIS — K7460 Unspecified cirrhosis of liver: Secondary | ICD-10-CM | POA: Diagnosis not present

## 2023-04-28 DIAGNOSIS — K769 Liver disease, unspecified: Secondary | ICD-10-CM | POA: Diagnosis not present

## 2023-04-28 DIAGNOSIS — R188 Other ascites: Secondary | ICD-10-CM | POA: Diagnosis not present

## 2023-04-28 DIAGNOSIS — E1165 Type 2 diabetes mellitus with hyperglycemia: Secondary | ICD-10-CM | POA: Diagnosis not present

## 2023-04-28 DIAGNOSIS — E782 Mixed hyperlipidemia: Secondary | ICD-10-CM | POA: Diagnosis not present

## 2023-04-28 DIAGNOSIS — E669 Obesity, unspecified: Secondary | ICD-10-CM | POA: Diagnosis not present

## 2023-04-28 DIAGNOSIS — R053 Chronic cough: Secondary | ICD-10-CM | POA: Diagnosis not present

## 2023-05-01 ENCOUNTER — Other Ambulatory Visit (HOSPITAL_BASED_OUTPATIENT_CLINIC_OR_DEPARTMENT_OTHER): Payer: Self-pay | Admitting: Physician Assistant

## 2023-05-01 DIAGNOSIS — R188 Other ascites: Secondary | ICD-10-CM

## 2023-05-06 DIAGNOSIS — H5213 Myopia, bilateral: Secondary | ICD-10-CM | POA: Diagnosis not present

## 2023-05-06 DIAGNOSIS — H52223 Regular astigmatism, bilateral: Secondary | ICD-10-CM | POA: Diagnosis not present

## 2023-05-06 DIAGNOSIS — H25042 Posterior subcapsular polar age-related cataract, left eye: Secondary | ICD-10-CM | POA: Diagnosis not present

## 2023-05-06 DIAGNOSIS — H524 Presbyopia: Secondary | ICD-10-CM | POA: Diagnosis not present

## 2023-05-06 DIAGNOSIS — H04123 Dry eye syndrome of bilateral lacrimal glands: Secondary | ICD-10-CM | POA: Diagnosis not present

## 2023-05-06 DIAGNOSIS — E119 Type 2 diabetes mellitus without complications: Secondary | ICD-10-CM | POA: Diagnosis not present

## 2023-05-06 DIAGNOSIS — H25013 Cortical age-related cataract, bilateral: Secondary | ICD-10-CM | POA: Diagnosis not present

## 2023-05-12 DIAGNOSIS — K7460 Unspecified cirrhosis of liver: Secondary | ICD-10-CM | POA: Diagnosis not present

## 2023-05-12 DIAGNOSIS — E669 Obesity, unspecified: Secondary | ICD-10-CM | POA: Diagnosis not present

## 2023-05-12 DIAGNOSIS — R188 Other ascites: Secondary | ICD-10-CM | POA: Diagnosis not present

## 2023-05-12 DIAGNOSIS — E782 Mixed hyperlipidemia: Secondary | ICD-10-CM | POA: Diagnosis not present

## 2023-05-12 DIAGNOSIS — E1165 Type 2 diabetes mellitus with hyperglycemia: Secondary | ICD-10-CM | POA: Diagnosis not present

## 2023-05-12 DIAGNOSIS — D509 Iron deficiency anemia, unspecified: Secondary | ICD-10-CM | POA: Diagnosis not present

## 2023-05-12 DIAGNOSIS — I10 Essential (primary) hypertension: Secondary | ICD-10-CM | POA: Diagnosis not present

## 2023-05-12 DIAGNOSIS — R053 Chronic cough: Secondary | ICD-10-CM | POA: Diagnosis not present

## 2023-05-13 ENCOUNTER — Ambulatory Visit (HOSPITAL_BASED_OUTPATIENT_CLINIC_OR_DEPARTMENT_OTHER)
Admission: RE | Admit: 2023-05-13 | Discharge: 2023-05-13 | Disposition: A | Discharge status: Home / Self Care | Source: Ambulatory Visit | Attending: Physician Assistant | Admitting: Physician Assistant

## 2023-05-13 DIAGNOSIS — K760 Fatty (change of) liver, not elsewhere classified: Secondary | ICD-10-CM | POA: Diagnosis not present

## 2023-05-13 DIAGNOSIS — K7460 Unspecified cirrhosis of liver: Secondary | ICD-10-CM | POA: Insufficient documentation

## 2023-05-13 DIAGNOSIS — R188 Other ascites: Secondary | ICD-10-CM | POA: Insufficient documentation

## 2023-05-13 MED ORDER — GADOBUTROL 1 MMOL/ML IV SOLN
10.0000 mL | Freq: Once | INTRAVENOUS | Status: AC | PRN
  Administered 2023-05-13: 10 mL via INTRAVENOUS

## 2023-05-19 DIAGNOSIS — D509 Iron deficiency anemia, unspecified: Secondary | ICD-10-CM | POA: Diagnosis not present

## 2023-05-19 DIAGNOSIS — I10 Essential (primary) hypertension: Secondary | ICD-10-CM | POA: Diagnosis not present

## 2023-05-19 DIAGNOSIS — K7460 Unspecified cirrhosis of liver: Secondary | ICD-10-CM | POA: Diagnosis not present

## 2023-05-19 DIAGNOSIS — R110 Nausea: Secondary | ICD-10-CM | POA: Diagnosis not present

## 2023-05-19 DIAGNOSIS — E782 Mixed hyperlipidemia: Secondary | ICD-10-CM | POA: Diagnosis not present

## 2023-05-19 DIAGNOSIS — N179 Acute kidney failure, unspecified: Secondary | ICD-10-CM | POA: Diagnosis not present

## 2023-05-19 DIAGNOSIS — E669 Obesity, unspecified: Secondary | ICD-10-CM | POA: Diagnosis not present

## 2023-05-19 DIAGNOSIS — R188 Other ascites: Secondary | ICD-10-CM | POA: Diagnosis not present

## 2023-05-19 DIAGNOSIS — E1165 Type 2 diabetes mellitus with hyperglycemia: Secondary | ICD-10-CM | POA: Diagnosis not present

## 2023-06-03 ENCOUNTER — Other Ambulatory Visit (HOSPITAL_COMMUNITY): Payer: Self-pay | Admitting: Nurse Practitioner

## 2023-06-03 DIAGNOSIS — K7581 Nonalcoholic steatohepatitis (NASH): Secondary | ICD-10-CM | POA: Diagnosis not present

## 2023-06-03 DIAGNOSIS — K766 Portal hypertension: Secondary | ICD-10-CM | POA: Diagnosis not present

## 2023-06-03 DIAGNOSIS — I8510 Secondary esophageal varices without bleeding: Secondary | ICD-10-CM

## 2023-06-03 DIAGNOSIS — K7469 Other cirrhosis of liver: Secondary | ICD-10-CM

## 2023-06-03 DIAGNOSIS — R053 Chronic cough: Secondary | ICD-10-CM

## 2023-06-03 DIAGNOSIS — R188 Other ascites: Secondary | ICD-10-CM | POA: Diagnosis not present

## 2023-06-08 ENCOUNTER — Ambulatory Visit (HOSPITAL_COMMUNITY)
Admission: RE | Admit: 2023-06-08 | Discharge: 2023-06-08 | Disposition: A | Discharge status: Home / Self Care | Source: Ambulatory Visit | Attending: Nurse Practitioner | Admitting: Nurse Practitioner

## 2023-06-08 DIAGNOSIS — R053 Chronic cough: Secondary | ICD-10-CM | POA: Diagnosis not present

## 2023-06-08 DIAGNOSIS — K7460 Unspecified cirrhosis of liver: Secondary | ICD-10-CM | POA: Diagnosis not present

## 2023-06-08 DIAGNOSIS — R188 Other ascites: Secondary | ICD-10-CM | POA: Insufficient documentation

## 2023-06-08 DIAGNOSIS — K7581 Nonalcoholic steatohepatitis (NASH): Secondary | ICD-10-CM | POA: Insufficient documentation

## 2023-06-08 DIAGNOSIS — K766 Portal hypertension: Secondary | ICD-10-CM | POA: Insufficient documentation

## 2023-06-08 DIAGNOSIS — I8510 Secondary esophageal varices without bleeding: Secondary | ICD-10-CM | POA: Diagnosis not present

## 2023-06-08 DIAGNOSIS — K7469 Other cirrhosis of liver: Secondary | ICD-10-CM | POA: Insufficient documentation

## 2023-06-08 HISTORY — PX: IR PARACENTESIS: IMG2679

## 2023-06-08 LAB — ALBUMIN, PLEURAL OR PERITONEAL FLUID: Albumin, Fluid: <1.5 g/dL

## 2023-06-08 LAB — BODY FLUID CELL COUNT WITH DIFFERENTIAL
Eos, Fluid: 0 %
Lymphs, Fluid: 53 %
Monocyte-Macrophage-Serous Fluid: 27 % — ABNORMAL LOW (ref 50–90)
Neutrophil Count, Fluid: 20 % (ref 0–25)
Total Nucleated Cell Count, Fluid: 461 uL (ref 0–1000)

## 2023-06-08 LAB — GRAM STAIN

## 2023-06-08 LAB — PROTEIN, PLEURAL OR PERITONEAL FLUID: Total protein, fluid: <3 g/dL

## 2023-06-08 MED ORDER — LIDOCAINE HCL 1 % IJ SOLN
20.0000 mL | Freq: Once | INTRAMUSCULAR | Status: AC
  Administered 2023-06-08: 10 mL

## 2023-06-08 MED ORDER — LIDOCAINE HCL 1 % IJ SOLN
INTRAMUSCULAR | Status: AC
  Filled 2023-06-08: qty 20

## 2023-06-08 NOTE — Procedures (Signed)
 PROCEDURE SUMMARY:  Successful US  guided paracentesis from left lateral abdomen.  Yielded 3.2 liters of clear yellow fluid.  No immediate complications.  Patient tolerated well.  EBL = trace  Specimen was sent for labs.  Abundio Hoit Afnan Emberton [name] 06/08/2023 2:34 PM

## 2023-06-09 ENCOUNTER — Other Ambulatory Visit (HOSPITAL_COMMUNITY): Payer: Self-pay | Admitting: Nurse Practitioner

## 2023-06-09 DIAGNOSIS — I8510 Secondary esophageal varices without bleeding: Secondary | ICD-10-CM

## 2023-06-09 DIAGNOSIS — R188 Other ascites: Secondary | ICD-10-CM

## 2023-06-09 DIAGNOSIS — K7469 Other cirrhosis of liver: Secondary | ICD-10-CM

## 2023-06-09 DIAGNOSIS — K7581 Nonalcoholic steatohepatitis (NASH): Secondary | ICD-10-CM

## 2023-06-10 LAB — CYTOLOGY - NON PAP

## 2023-06-13 LAB — CULTURE, BODY FLUID W GRAM STAIN -BOTTLE: Culture: NO GROWTH

## 2023-06-15 DIAGNOSIS — D122 Benign neoplasm of ascending colon: Secondary | ICD-10-CM | POA: Diagnosis not present

## 2023-06-15 DIAGNOSIS — E1129 Type 2 diabetes mellitus with other diabetic kidney complication: Secondary | ICD-10-CM | POA: Diagnosis not present

## 2023-06-15 DIAGNOSIS — M1A09X0 Idiopathic chronic gout, multiple sites, without tophus (tophi): Secondary | ICD-10-CM | POA: Diagnosis not present

## 2023-06-15 DIAGNOSIS — Z0001 Encounter for general adult medical examination with abnormal findings: Secondary | ICD-10-CM | POA: Diagnosis not present

## 2023-06-15 DIAGNOSIS — E782 Mixed hyperlipidemia: Secondary | ICD-10-CM | POA: Diagnosis not present

## 2023-06-15 DIAGNOSIS — R809 Proteinuria, unspecified: Secondary | ICD-10-CM | POA: Diagnosis not present

## 2023-06-15 DIAGNOSIS — E1122 Type 2 diabetes mellitus with diabetic chronic kidney disease: Secondary | ICD-10-CM | POA: Diagnosis not present

## 2023-06-15 DIAGNOSIS — N1831 Chronic kidney disease, stage 3a: Secondary | ICD-10-CM | POA: Diagnosis not present

## 2023-06-15 DIAGNOSIS — I119 Hypertensive heart disease without heart failure: Secondary | ICD-10-CM | POA: Diagnosis not present

## 2023-06-15 DIAGNOSIS — D500 Iron deficiency anemia secondary to blood loss (chronic): Secondary | ICD-10-CM | POA: Diagnosis not present

## 2023-06-23 ENCOUNTER — Ambulatory Visit (HOSPITAL_COMMUNITY)
Admission: RE | Admit: 2023-06-23 | Discharge: 2023-06-23 | Disposition: A | Discharge status: Home / Self Care | Source: Ambulatory Visit | Attending: Nurse Practitioner | Admitting: Nurse Practitioner

## 2023-06-23 ENCOUNTER — Other Ambulatory Visit (HOSPITAL_COMMUNITY): Payer: Self-pay | Admitting: Nurse Practitioner

## 2023-06-23 DIAGNOSIS — R188 Other ascites: Secondary | ICD-10-CM | POA: Insufficient documentation

## 2023-06-23 DIAGNOSIS — K7581 Nonalcoholic steatohepatitis (NASH): Secondary | ICD-10-CM | POA: Diagnosis not present

## 2023-06-23 DIAGNOSIS — K7469 Other cirrhosis of liver: Secondary | ICD-10-CM

## 2023-06-23 DIAGNOSIS — I8510 Secondary esophageal varices without bleeding: Secondary | ICD-10-CM | POA: Insufficient documentation

## 2023-06-23 DIAGNOSIS — K7460 Unspecified cirrhosis of liver: Secondary | ICD-10-CM | POA: Diagnosis not present

## 2023-06-23 DIAGNOSIS — K760 Fatty (change of) liver, not elsewhere classified: Secondary | ICD-10-CM | POA: Diagnosis not present

## 2023-06-23 MED ORDER — LIDOCAINE HCL 1 % IJ SOLN
INTRAMUSCULAR | Status: AC
  Filled 2023-06-23: qty 20

## 2023-06-23 NOTE — Procedures (Signed)
 Patient presents for a paracentesis at the request of Drazek, Dawn, CRNP. US  limited all four abdominal quadrants shows trace amount of ascites fluid noted.  Insufficient to perform a safe paracentesis. Procedure not performed.

## 2023-07-03 DIAGNOSIS — E1129 Type 2 diabetes mellitus with other diabetic kidney complication: Secondary | ICD-10-CM | POA: Diagnosis not present

## 2023-07-03 DIAGNOSIS — R809 Proteinuria, unspecified: Secondary | ICD-10-CM | POA: Diagnosis not present

## 2023-07-03 DIAGNOSIS — I119 Hypertensive heart disease without heart failure: Secondary | ICD-10-CM | POA: Diagnosis not present

## 2023-07-03 DIAGNOSIS — N3001 Acute cystitis with hematuria: Secondary | ICD-10-CM | POA: Diagnosis not present

## 2023-07-03 DIAGNOSIS — R300 Dysuria: Secondary | ICD-10-CM | POA: Diagnosis not present

## 2023-07-03 DIAGNOSIS — E782 Mixed hyperlipidemia: Secondary | ICD-10-CM | POA: Diagnosis not present

## 2023-07-03 DIAGNOSIS — D500 Iron deficiency anemia secondary to blood loss (chronic): Secondary | ICD-10-CM | POA: Diagnosis not present

## 2023-07-03 DIAGNOSIS — E1122 Type 2 diabetes mellitus with diabetic chronic kidney disease: Secondary | ICD-10-CM | POA: Diagnosis not present

## 2023-07-03 DIAGNOSIS — N1831 Chronic kidney disease, stage 3a: Secondary | ICD-10-CM | POA: Diagnosis not present

## 2023-07-03 DIAGNOSIS — E1140 Type 2 diabetes mellitus with diabetic neuropathy, unspecified: Secondary | ICD-10-CM | POA: Diagnosis not present

## 2023-07-03 DIAGNOSIS — D122 Benign neoplasm of ascending colon: Secondary | ICD-10-CM | POA: Diagnosis not present

## 2023-07-03 DIAGNOSIS — M1A09X0 Idiopathic chronic gout, multiple sites, without tophus (tophi): Secondary | ICD-10-CM | POA: Diagnosis not present

## 2023-07-04 NOTE — Progress Notes (Signed)
 Labs reviewed.  Patient with positive ANA, I doubt active AIH given normal aminotransferases.  Would recommend referral to rheumatology which can be arranged by PCP.    Recommend vaccination for HAV and HBV.

## 2023-07-09 DIAGNOSIS — K7469 Other cirrhosis of liver: Secondary | ICD-10-CM | POA: Diagnosis not present

## 2023-07-09 DIAGNOSIS — R188 Other ascites: Secondary | ICD-10-CM | POA: Diagnosis not present

## 2023-07-09 DIAGNOSIS — D500 Iron deficiency anemia secondary to blood loss (chronic): Secondary | ICD-10-CM | POA: Diagnosis not present

## 2023-07-10 DIAGNOSIS — Z7984 Long term (current) use of oral hypoglycemic drugs: Secondary | ICD-10-CM | POA: Diagnosis not present

## 2023-07-10 DIAGNOSIS — M109 Gout, unspecified: Secondary | ICD-10-CM | POA: Diagnosis not present

## 2023-07-10 DIAGNOSIS — Z79890 Hormone replacement therapy (postmenopausal): Secondary | ICD-10-CM | POA: Diagnosis not present

## 2023-07-10 DIAGNOSIS — D631 Anemia in chronic kidney disease: Secondary | ICD-10-CM | POA: Diagnosis not present

## 2023-07-10 DIAGNOSIS — Z90710 Acquired absence of both cervix and uterus: Secondary | ICD-10-CM | POA: Diagnosis not present

## 2023-07-10 DIAGNOSIS — E1165 Type 2 diabetes mellitus with hyperglycemia: Secondary | ICD-10-CM | POA: Diagnosis not present

## 2023-07-10 DIAGNOSIS — K7460 Unspecified cirrhosis of liver: Secondary | ICD-10-CM | POA: Diagnosis not present

## 2023-07-10 DIAGNOSIS — Z79899 Other long term (current) drug therapy: Secondary | ICD-10-CM | POA: Diagnosis not present

## 2023-07-10 DIAGNOSIS — N179 Acute kidney failure, unspecified: Secondary | ICD-10-CM | POA: Diagnosis not present

## 2023-07-10 DIAGNOSIS — K7581 Nonalcoholic steatohepatitis (NASH): Secondary | ICD-10-CM | POA: Diagnosis not present

## 2023-07-10 DIAGNOSIS — I129 Hypertensive chronic kidney disease with stage 1 through stage 4 chronic kidney disease, or unspecified chronic kidney disease: Secondary | ICD-10-CM | POA: Diagnosis not present

## 2023-07-10 DIAGNOSIS — E1122 Type 2 diabetes mellitus with diabetic chronic kidney disease: Secondary | ICD-10-CM | POA: Diagnosis not present

## 2023-07-10 DIAGNOSIS — N390 Urinary tract infection, site not specified: Secondary | ICD-10-CM | POA: Diagnosis not present

## 2023-07-10 DIAGNOSIS — N184 Chronic kidney disease, stage 4 (severe): Secondary | ICD-10-CM | POA: Diagnosis not present

## 2023-07-10 DIAGNOSIS — T502X5A Adverse effect of carbonic-anhydrase inhibitors, benzothiadiazides and other diuretics, initial encounter: Secondary | ICD-10-CM | POA: Diagnosis not present

## 2023-07-10 DIAGNOSIS — E785 Hyperlipidemia, unspecified: Secondary | ICD-10-CM | POA: Diagnosis not present

## 2023-07-10 DIAGNOSIS — E039 Hypothyroidism, unspecified: Secondary | ICD-10-CM | POA: Diagnosis not present

## 2023-07-14 ENCOUNTER — Telehealth: Payer: Self-pay

## 2023-07-14 NOTE — Patient Instructions (Addendum)
 Visit Information  Thank you for taking time to visit with me today. Please don't hesitate to contact me if I can be of assistance to you.   UTI Drink plenty of water or other fluids. Take antibiotics exactly as your healthcare provider tells you. Monitor for fever, chills, increased urination, burning on urination, blood in urine, nausea, vomiting, or pain in abdomen   The patient verbalized understanding of instructions, educational materials, and care plan provided today and DECLINED offer to receive copy of patient instructions, educational materials, and care plan.   The patient has been provided with contact information for the care management team and has been advised to call with any health related questions or concerns.   Please call the care guide team at [phone] if you need to cancel or reschedule your appointment.   Please call the Suicide and Crisis Lifeline: 988 if you are experiencing a Mental Health or Behavioral Health Crisis or need someone to talk to.  Canyon Lohr J. Adja Ruff RN, MSN [facility], [facility] RN Care Manager Direct Dial: [phone]  Fax: [phone] Website: [email or web address]

## 2023-07-14 NOTE — Transitions of Care (Post Inpatient/ED Visit) (Signed)
 07/14/2023  Name: Robin Wallace MRN: [id number] DOB: Apr 21, 1950  Today's TOC FU Call Status: Today's TOC FU Call Status:: Successful TOC FU Call Completed TOC FU Call Complete Date: 07/14/23 Patient's Name and Date of Birth confirmed.  Transition Care Management Follow-up Telephone Call Date of Discharge: 07/13/23 Discharge Facility: Other [facility]) Name of Other ([facility]) Discharge Facility: [facility] Type of Discharge: Inpatient Admission Primary Inpatient Discharge Diagnosis:: Acute Kidney Injury How have you been since you were released from the hospital?: Better Any questions or concerns?: No  Items Reviewed: Did you receive and understand the discharge instructions provided?: No Medications obtained,verified, and reconciled?: Yes (Medications Reviewed) Any new allergies since your discharge?: No Dietary orders reviewed?: Yes Type of Diet Ordered:: Low sodium 1500 calorie Do you have support at home?: Yes People in Home [RPT]: child(ren), adult Name of Support/Comfort Primary Source: Shanda  Medications Reviewed Today: Medications Reviewed Today     Reviewed by Zubair Lofton, RN (Case Manager) on 07/14/23 at 1140  Med List Status: <None>   Medication Order Taking? Sig Documenting Provider Last Dose Status Informant  albuterol (VENTOLIN HFA) 108 (90 Base) MCG/ACT inhaler [id number] Yes Inhale 2 puffs into the lungs every 4 (four) hours as needed for wheezing or shortness of breath. [provider]  Active   allopurinol (ZYLOPRIM) 100 MG tablet [id number] Yes Take 100 mg by mouth daily. [provider]  Active   atorvastatin (LIPITOR) 40 MG tablet [id number]    Patient not taking: Reported on 07/14/2023   [provider]  Active            Med Note Gatha Kaska Jun 07, 2014  9:41 AM) Received from: External Pharmacy  azithromycin (ZITHROMAX) 250 MG tablet [id number]    Patient not taking: Reported on  07/14/2023   [provider]  Active            Med Note Gatha Kaska Jun 07, 2014  9:41 AM) Received from: External Pharmacy  benzonatate  (TESSALON ) 100 MG capsule [id number]  Take 1 capsule (100 mg total) by mouth every 8 (eight) hours.  Patient not taking: Reported on 07/14/2023   Albertus Hughs, DO  Active   cephALEXin  (KEFLEX ) 500 MG capsule [id number]  Take 1 capsule (500 mg total) by mouth 2 (two) times daily.  Patient not taking: Reported on 07/14/2023   [name], [name], MD  Active   Cholecalciferol 1.25 MG (50000 UT) TABS [id number] Yes Take 1,250 mcg by mouth once a week. [provider]  Active   colchicine (COLCRYS) 0.6 MG tablet [id number] Yes Take 0.6 mg by mouth daily. [provider]  Active   Continuous Glucose Sensor (FREESTYLE LIBRE 14 DAY SENSOR) [place] [id number] Yes 1 Device by Does not apply route once a week. [provider]  Active   ferrous sulfate (FEROSUL) 325 (65 FE) MG tablet [id number] Yes Take 325 mg by mouth daily with breakfast. [provider]  Active   fexofenadine  (ALLEGRA ) 60 MG tablet [id number]  Take 1 tablet (60 mg total) by mouth 2 (two) times daily.  Patient not taking: Reported on 07/14/2023   Karlos Overlie, MD  Expired 05/27/16 2359   furosemide (LASIX) 20 MG tablet [id number] Yes Take 40 mg by mouth 2 (two) times daily. [provider]  Active   gabapentin (NEURONTIN) 600 MG tablet [id number] Yes Take 600 mg by mouth 3 (three) times daily. [provider]  Active   HYDROcodone -acetaminophen  (NORCO/VICODIN) 5-325 MG per tablet [id number]  Take 1 tablet by mouth every 4 (four) hours as needed.  Patient not taking: Reported on 07/14/2023   [name], Clover Dao, DO  Active   HYDROcodone -homatropine (HYCODAN) 5-1.5 MG/5ML syrup [id number]  Take 5 mLs by mouth every 6 (six) hours as needed for cough.  Patient not taking: Reported on 07/14/2023   Hershel Los, MD  Active   ibuprofen  (ADVIL ,MOTRIN ) 800 MG  tablet [id number]  Take 1 tablet (800 mg total) by mouth every 8 (eight) hours as needed for mild pain.  Patient not taking: Reported on 07/14/2023   [name], Clover Dao, DO  Active   levothyroxine (SYNTHROID, LEVOTHROID) 50 MCG tablet [id number] Yes Take 50 mcg by mouth daily before breakfast. [provider]  Active   lidocaine  (LIDODERM ) 5 % [id number]  Place 1 patch onto the skin daily. Remove & Discard patch within 12 hours or as directed by MD  Patient not taking: Reported on 07/14/2023   [name], [name], [name]  Active   lisinopril-hydrochlorothiazide (PRINZIDE,ZESTORETIC) 20-25 MG per tablet [id number]  Take 1 tablet by mouth daily.  Patient not taking: Reported on 07/14/2023   [provider]  Active   metFORMIN (GLUCOPHAGE) 500 MG tablet [id number] Yes Take 500 mg by mouth daily with breakfast.  Patient taking differently: Take 500 mg by mouth daily with breakfast. 850 mg twice daily   [provider]  Active   mometasone  (NASONEX ) 50 MCG/ACT nasal spray [id number]  Place 2 sprays into the nose daily.  Patient not taking: Reported on 07/14/2023   Karlos Overlie, MD  Active   nitrofurantoin, macrocrystal-monohydrate, (MACROBID) 100 MG capsule [id number] Yes Take 100 mg by mouth 2 (two) times daily. [provider]  Active   ondansetron  (ZOFRAN -ODT) 4 MG disintegrating tablet [id number]  Dissolve 1 tablet under the tongue every 4 hours as needed for nausea/vomitting  Patient not taking: Reported on 07/14/2023   Floyd, Dan, DO  Active   predniSONE  (DELTASONE ) 20 MG tablet [id number]  3 tabs po day one, then 2 po daily x 4 days  Patient not taking: Reported on 07/14/2023   Arlee Lace, [name]  Active   promethazine  (PHENERGAN ) 25 MG tablet [id number]  Take 1 tablet (25 mg total) by mouth every 6 (six) hours as needed for nausea.  Patient not taking: Reported on 07/14/2023   Arlee Lace, [name]  Active   rosuvastatin (CRESTOR) 10 MG tablet [id number] Yes Take 10 mg by mouth  daily. [provider]  Active   SIMVASTATIN PO [id number]  Take by mouth.  Patient not taking: Reported on 07/14/2023   [provider]  Active   spironolactone (ALDACTONE) 25 MG tablet [id number] Yes Take 50 mg by mouth daily. [provider]  Active             Home Care and Equipment/Supplies: Were Home Health Services Ordered?: No Any new equipment or medical supplies ordered?: No  Functional Questionnaire: Do you need assistance with bathing/showering or dressing?: No Do you need assistance with meal preparation?: No Do you need assistance with eating?: No Do you have difficulty maintaining continence: No Do you need assistance with getting out of bed/getting out of a chair/moving?: No Do you have difficulty managing or taking your medications?: No  Follow up appointments reviewed: PCP Follow-up appointment confirmed?: Yes Date of PCP follow-up appointment?: 07/17/23 Follow-up Provider: Dr. Sherlene Diss Specialist [facility] Follow-up appointment confirmed?: Yes Date of Specialist follow-up appointment?: 07/23/23  Follow-Up Specialty Provider:: Liver specialist Do you need transportation to your follow-up appointment?: No Do you understand care options if your condition(s) worsen?: Yes-patient verbalized understanding  SDOH Interventions Today    Flowsheet Row Most Recent Value  SDOH Interventions   Food Insecurity Interventions Intervention Not Indicated  Housing Interventions Intervention Not Indicated  Transportation Interventions Intervention Not Indicated  Utilities Interventions Intervention Not Indicated   Patient reports doing well.  PCP follow up 07/17/23.  No medication issues.     Berea Majkowski J. Bernyce Brimley RN, MSN [facility], [facility] RN Care Manager Direct Dial: [phone]  Fax: [phone] Website: [email or web address]

## 2023-07-17 DIAGNOSIS — E1122 Type 2 diabetes mellitus with diabetic chronic kidney disease: Secondary | ICD-10-CM | POA: Diagnosis not present

## 2023-07-17 DIAGNOSIS — M1A09X0 Idiopathic chronic gout, multiple sites, without tophus (tophi): Secondary | ICD-10-CM | POA: Diagnosis not present

## 2023-07-17 DIAGNOSIS — E1129 Type 2 diabetes mellitus with other diabetic kidney complication: Secondary | ICD-10-CM | POA: Diagnosis not present

## 2023-07-17 DIAGNOSIS — I119 Hypertensive heart disease without heart failure: Secondary | ICD-10-CM | POA: Diagnosis not present

## 2023-07-17 DIAGNOSIS — E782 Mixed hyperlipidemia: Secondary | ICD-10-CM | POA: Diagnosis not present

## 2023-07-17 DIAGNOSIS — N3000 Acute cystitis without hematuria: Secondary | ICD-10-CM | POA: Diagnosis not present

## 2023-07-17 DIAGNOSIS — E1140 Type 2 diabetes mellitus with diabetic neuropathy, unspecified: Secondary | ICD-10-CM | POA: Diagnosis not present

## 2023-07-17 DIAGNOSIS — D500 Iron deficiency anemia secondary to blood loss (chronic): Secondary | ICD-10-CM | POA: Diagnosis not present

## 2023-07-17 DIAGNOSIS — N1831 Chronic kidney disease, stage 3a: Secondary | ICD-10-CM | POA: Diagnosis not present

## 2023-08-03 DIAGNOSIS — N1832 Chronic kidney disease, stage 3b: Secondary | ICD-10-CM | POA: Diagnosis not present

## 2023-08-03 DIAGNOSIS — N189 Chronic kidney disease, unspecified: Secondary | ICD-10-CM | POA: Diagnosis not present

## 2023-08-03 DIAGNOSIS — E559 Vitamin D deficiency, unspecified: Secondary | ICD-10-CM | POA: Diagnosis not present

## 2023-08-03 DIAGNOSIS — I129 Hypertensive chronic kidney disease with stage 1 through stage 4 chronic kidney disease, or unspecified chronic kidney disease: Secondary | ICD-10-CM | POA: Diagnosis not present

## 2023-08-03 DIAGNOSIS — D631 Anemia in chronic kidney disease: Secondary | ICD-10-CM | POA: Diagnosis not present

## 2023-08-21 ENCOUNTER — Other Ambulatory Visit (HOSPITAL_COMMUNITY): Payer: Self-pay | Admitting: *Deleted

## 2023-08-25 ENCOUNTER — Inpatient Hospital Stay (HOSPITAL_COMMUNITY): Admission: RE | Admit: 2023-08-25 | Source: Ambulatory Visit

## 2023-09-01 ENCOUNTER — Encounter (HOSPITAL_COMMUNITY)

## 2023-09-24 DIAGNOSIS — I8510 Secondary esophageal varices without bleeding: Secondary | ICD-10-CM | POA: Diagnosis not present

## 2023-09-24 DIAGNOSIS — K7581 Nonalcoholic steatohepatitis (NASH): Secondary | ICD-10-CM | POA: Diagnosis not present

## 2023-09-24 DIAGNOSIS — K766 Portal hypertension: Secondary | ICD-10-CM | POA: Diagnosis not present

## 2023-09-24 DIAGNOSIS — K7469 Other cirrhosis of liver: Secondary | ICD-10-CM | POA: Diagnosis not present

## 2023-09-24 DIAGNOSIS — E440 Moderate protein-calorie malnutrition: Secondary | ICD-10-CM | POA: Diagnosis not present

## 2023-09-24 DIAGNOSIS — R188 Other ascites: Secondary | ICD-10-CM | POA: Diagnosis not present

## 2023-09-25 DIAGNOSIS — D649 Anemia, unspecified: Secondary | ICD-10-CM | POA: Diagnosis not present

## 2023-09-25 DIAGNOSIS — D696 Thrombocytopenia, unspecified: Secondary | ICD-10-CM | POA: Diagnosis not present

## 2023-09-25 DIAGNOSIS — N200 Calculus of kidney: Secondary | ICD-10-CM | POA: Diagnosis not present

## 2023-09-25 DIAGNOSIS — Z79890 Hormone replacement therapy (postmenopausal): Secondary | ICD-10-CM | POA: Diagnosis not present

## 2023-09-25 DIAGNOSIS — N183 Chronic kidney disease, stage 3 unspecified: Secondary | ICD-10-CM | POA: Diagnosis not present

## 2023-09-25 DIAGNOSIS — E039 Hypothyroidism, unspecified: Secondary | ICD-10-CM | POA: Diagnosis not present

## 2023-09-25 DIAGNOSIS — N184 Chronic kidney disease, stage 4 (severe): Secondary | ICD-10-CM | POA: Diagnosis not present

## 2023-09-25 DIAGNOSIS — Z7984 Long term (current) use of oral hypoglycemic drugs: Secondary | ICD-10-CM | POA: Diagnosis not present

## 2023-09-25 DIAGNOSIS — K760 Fatty (change of) liver, not elsewhere classified: Secondary | ICD-10-CM | POA: Diagnosis not present

## 2023-09-25 DIAGNOSIS — R944 Abnormal results of kidney function studies: Secondary | ICD-10-CM | POA: Diagnosis not present

## 2023-09-25 DIAGNOSIS — K7460 Unspecified cirrhosis of liver: Secondary | ICD-10-CM | POA: Diagnosis not present

## 2023-09-25 DIAGNOSIS — E1122 Type 2 diabetes mellitus with diabetic chronic kidney disease: Secondary | ICD-10-CM | POA: Diagnosis not present

## 2023-09-25 DIAGNOSIS — M109 Gout, unspecified: Secondary | ICD-10-CM | POA: Diagnosis not present

## 2023-09-25 DIAGNOSIS — E785 Hyperlipidemia, unspecified: Secondary | ICD-10-CM | POA: Diagnosis not present

## 2023-09-25 DIAGNOSIS — M5412 Radiculopathy, cervical region: Secondary | ICD-10-CM | POA: Diagnosis not present

## 2023-09-25 DIAGNOSIS — I10 Essential (primary) hypertension: Secondary | ICD-10-CM | POA: Diagnosis not present

## 2023-09-25 DIAGNOSIS — D508 Other iron deficiency anemias: Secondary | ICD-10-CM | POA: Diagnosis not present

## 2023-09-25 DIAGNOSIS — N289 Disorder of kidney and ureter, unspecified: Secondary | ICD-10-CM | POA: Diagnosis not present

## 2023-09-25 DIAGNOSIS — K7469 Other cirrhosis of liver: Secondary | ICD-10-CM | POA: Diagnosis not present

## 2023-09-25 DIAGNOSIS — Z79899 Other long term (current) drug therapy: Secondary | ICD-10-CM | POA: Diagnosis not present

## 2023-09-25 DIAGNOSIS — N179 Acute kidney failure, unspecified: Secondary | ICD-10-CM | POA: Diagnosis not present

## 2023-09-25 DIAGNOSIS — I129 Hypertensive chronic kidney disease with stage 1 through stage 4 chronic kidney disease, or unspecified chronic kidney disease: Secondary | ICD-10-CM | POA: Diagnosis not present

## 2023-09-25 DIAGNOSIS — R7989 Other specified abnormal findings of blood chemistry: Secondary | ICD-10-CM | POA: Diagnosis not present

## 2023-09-25 DIAGNOSIS — Z794 Long term (current) use of insulin: Secondary | ICD-10-CM | POA: Diagnosis not present

## 2023-09-29 ENCOUNTER — Telehealth: Payer: Self-pay

## 2023-09-29 NOTE — Transitions of Care (Post Inpatient/ED Visit) (Signed)
 09/29/2023  Name: Robin Wallace MRN: [id number] DOB: 08-13-1950  Today's TOC FU Call Status: Today's TOC FU Call Status:: Successful TOC FU Call Completed TOC FU Call Complete Date: 09/29/23 Patient's Name and Date of Birth confirmed.  Transition Care Management Follow-up Telephone Call Date of Discharge: 09/28/23 Discharge Facility: Other ([facility]) Name of Other [facility] Facility: [facility]. Type of Discharge: Inpatient Admission Primary Inpatient Discharge Diagnosis:: Acute Kidney Injury How have you been since you were released from the hospital?: Same Any questions or concerns?: No  Items Reviewed: Did you receive and understand the discharge instructions provided?: Yes Medications obtained,verified, and reconciled?: Yes (Medications Reviewed) Any new allergies since your discharge?: No Dietary orders reviewed?: Yes Type of Diet Ordered:: regular Do you have support at home?: Yes People in Home [RPT]: child(ren), adult Name of Support/Comfort Primary Source: [name]  Medications Reviewed Today: Medications Reviewed Today     Reviewed by [name], RN (Registered Nurse) on 09/29/23 at 1501  Med List Status: <None>   Medication Order Taking? Sig Documenting Provider Last Dose Status Informant  albuterol (VENTOLIN HFA) 108 (90 Base) MCG/ACT inhaler [id number] Yes Inhale 2 puffs into the lungs every 4 (four) hours as needed for wheezing or shortness of breath. [provider]  Active   allopurinol (ZYLOPRIM) 100 MG tablet [id number] Yes Take 100 mg by mouth daily. [provider]  Active   atorvastatin (LIPITOR) 40 MG tablet [id number]    Patient not taking: Reported on 09/29/2023   [provider]  Active            Med Note [name] [name] Jun 07, 2014  9:41 AM) Received from: External Pharmacy  azithromycin (ZITHROMAX) 250 MG tablet [id number]    Patient not taking: Reported on 09/29/2023    [provider]  Active            Med Note [name] [name] Jun 07, 2014  9:41 AM) Received from: External Pharmacy  benzonatate  (TESSALON ) 100 MG capsule [id number]  Take 1 capsule (100 mg total) by mouth every 8 (eight) hours.  Patient not taking: Reported on 09/29/2023   [name], DO  Active   cephALEXin  (KEFLEX ) 500 MG capsule [id number]  Take 1 capsule (500 mg total) by mouth 2 (two) times daily.  Patient not taking: Reported on 09/29/2023   [name], [name], MD  Active   Cholecalciferol 1.25 MG (50000 UT) TABS [id number]  Take 1,250 mcg by mouth once a week.  Patient not taking: Reported on 09/29/2023   [provider]  Active   colchicine (COLCRYS) 0.6 MG tablet [id number] Yes Take 0.6 mg by mouth daily. [provider]  Active   Continuous Glucose Sensor (FREESTYLE LIBRE 14 DAY SENSOR) [place] [id number] Yes 1 Device by Does not apply route once a week. [provider]  Active   ferrous sulfate (FEROSUL) 325 (65 FE) MG tablet [id number] Yes Take 325 mg by mouth daily with breakfast. [provider]  Active   fexofenadine  (ALLEGRA ) 60 MG tablet [id number]  Take 1 tablet (60 mg total) by mouth 2 (two) times daily.  Patient not taking: Reported on 09/29/2023   [name], MD  Expired 05/27/16 2359   furosemide (LASIX) 20 MG tablet [id number] Yes Take 40 mg by mouth 2 (two) times daily.  Patient taking differently: Take 20 mg by mouth 2 (two) times daily. Patient states she is  taking 20 mg 1 x day   [provider]  Active   gabapentin (NEURONTIN) 600 MG tablet [id number]  Take 600 mg by mouth 3 (three) times daily.  Patient not taking: Reported on 09/29/2023   [provider]  Active   HYDROcodone -acetaminophen  (NORCO/VICODIN) 5-325 MG per tablet [id number]  Take 1 tablet by mouth every 4 (four) hours as needed.  Patient not taking: Reported on 09/29/2023   [name], [name], DO  Active   HYDROcodone -homatropine (HYCODAN) 5-1.5 MG/5ML  syrup [id number]  Take 5 mLs by mouth every 6 (six) hours as needed for cough.  Patient not taking: Reported on 09/29/2023   [name], MD  Active   ibuprofen  (ADVIL ,MOTRIN ) 800 MG tablet [id number]  Take 1 tablet (800 mg total) by mouth every 8 (eight) hours as needed for mild pain.  Patient not taking: Reported on 09/29/2023   [name], [name], DO  Active   levothyroxine (SYNTHROID, LEVOTHROID) 50 MCG tablet [id number] Yes Take 50 mcg by mouth daily before breakfast. [provider]  Active   lidocaine  (LIDODERM ) 5 % [id number]  Place 1 patch onto the skin daily. Remove & Discard patch within 12 hours or as directed by MD  Patient not taking: Reported on 09/29/2023   [name], [name], [name]  Active   lisinopril-hydrochlorothiazide (PRINZIDE,ZESTORETIC) 20-25 MG per tablet [id number]  Take 1 tablet by mouth daily.  Patient not taking: Reported on 09/29/2023   [provider]  Active   metFORMIN (GLUCOPHAGE) 500 MG tablet [id number]  Take 500 mg by mouth daily with breakfast.  Patient not taking: Reported on 09/29/2023   [provider]  Active   mometasone  (NASONEX ) 50 MCG/ACT nasal spray [id number]  Place 2 sprays into the nose daily.  Patient not taking: Reported on 09/29/2023   [name], MD  Active   nitrofurantoin, macrocrystal-monohydrate, (MACROBID) 100 MG capsule [id number]  Take 100 mg by mouth 2 (two) times daily.  Patient not taking: Reported on 09/29/2023   [provider]  Active   ondansetron  (ZOFRAN -ODT) 4 MG disintegrating tablet [id number]  Dissolve 1 tablet under the tongue every 4 hours as needed for nausea/vomitting  Patient not taking: Reported on 09/29/2023   [name], DO  Active   predniSONE  (DELTASONE ) 20 MG tablet [id number]  3 tabs po day one, then 2 po daily x 4 days  Patient not taking: Reported on 09/29/2023   [name], [name]  Active   promethazine  (PHENERGAN ) 25 MG tablet [id number]  Take 1 tablet (25 mg total) by mouth every 6 (six) hours  as needed for nausea.  Patient not taking: Reported on 09/29/2023   [name], [name]  Active   rosuvastatin (CRESTOR) 10 MG tablet [id number] Yes Take 10 mg by mouth daily. [provider]  Active   SIMVASTATIN PO [id number]  Take by mouth.  Patient not taking: Reported on 09/29/2023   [provider]  Active   spironolactone (ALDACTONE) 25 MG tablet [id number] Yes Take 50 mg by mouth daily. [provider]  Active             Home Care and Equipment/Supplies: Were Home Health Services Ordered?: No Any new equipment or medical supplies ordered?: No  Functional Questionnaire: Do you need assistance with bathing/showering or dressing?: No Do you need assistance with meal preparation?: No Do you need assistance with eating?: No Do you have difficulty maintaining continence: No Do you need assistance with getting out of bed/getting out of a  chair/moving?: No Do you have difficulty managing or taking your medications?: No  Follow up appointments reviewed: PCP Follow-up appointment confirmed?: No (offered to schedule hospital follow up visit with primary care provider. Patient declined stating she will schedule appointment.) Specialist Hospital Follow-up appointment confirmed?: Yes Date of Specialist follow-up appointment?: 10/08/23 Follow-Up Specialty Provider:: Dr. Tobie Do you need transportation to your follow-up appointment?: No Do you understand care options if your condition(s) worsen?: Yes-patient verbalized understanding  SDOH Interventions Today    Flowsheet Row Most Recent Value  SDOH Interventions   Food Insecurity Interventions Intervention Not Indicated  Housing Interventions Intervention Not Indicated  Transportation Interventions Intervention Not Indicated  Utilities Interventions Intervention Not Indicated   Discussed and offered 30 day TOC program.  Patient declined.  The patient has been provided with contact information for the care  management team and has been advised to call with any health -related questions or concerns.  The patient verbalized understanding with current plan of care.  The patient is directed to their insurance card regarding availability of benefits coverage.    Arvin Seip RN, BSN, CCM [employer], Population Health Case Manager Phone: [phone]

## 2023-09-29 NOTE — Patient Instructions (Signed)
 Visit Information  Thank you for taking time to visit with me today. Please don't hesitate to contact me if I can be of assistance to you   Patient instructions:  Weight daily and record. Notify provider for increase weight gain Take medications as prescribed.  Notify provider of any new/ ongoing symptoms Keep follow up appointments with provider and call your primary care provider to schedule a hospital follow up visit.  Call the transplant team (Liver doctor) and schedule a follow up   The patient verbalized understanding of instructions, educational materials, and care plan provided today and DECLINED offer to receive copy of patient instructions, educational materials, and care plan.   The patient has been provided with contact information for the care management team and has been advised to call with any health related questions or concerns.  No further follow up required: advised to call her primary care provider and schedule a hospital follow up visit.   Please call the care guide team at [phone] if you need to cancel or reschedule your appointment.   Please call the Suicide and Crisis Lifeline: 988 call the [place]  National Suicide Prevention Lifeline: 8284261120 or TTY: 229-304-2524 TTY 650-454-6761) to talk to a trained counselor call 1-800-273-TALK (toll free, 24 hour hotline) go to [facility] [address], [place] [phone]) if you are experiencing a Mental Health or Behavioral Health Crisis or need someone to talk to.  Arvin Seip RN, BSN, CCM [employer], Population Health Case Manager Phone: [phone]

## 2023-10-05 DIAGNOSIS — E1129 Type 2 diabetes mellitus with other diabetic kidney complication: Secondary | ICD-10-CM | POA: Diagnosis not present

## 2023-10-05 DIAGNOSIS — I1310 Hypertensive heart and chronic kidney disease without heart failure, with stage 1 through stage 4 chronic kidney disease, or unspecified chronic kidney disease: Secondary | ICD-10-CM | POA: Diagnosis not present

## 2023-10-05 DIAGNOSIS — R809 Proteinuria, unspecified: Secondary | ICD-10-CM | POA: Diagnosis not present

## 2023-10-05 DIAGNOSIS — N1832 Chronic kidney disease, stage 3b: Secondary | ICD-10-CM | POA: Diagnosis not present

## 2023-10-05 DIAGNOSIS — D500 Iron deficiency anemia secondary to blood loss (chronic): Secondary | ICD-10-CM | POA: Diagnosis not present

## 2023-10-05 DIAGNOSIS — E1122 Type 2 diabetes mellitus with diabetic chronic kidney disease: Secondary | ICD-10-CM | POA: Diagnosis not present

## 2023-10-05 DIAGNOSIS — E1165 Type 2 diabetes mellitus with hyperglycemia: Secondary | ICD-10-CM | POA: Diagnosis not present

## 2023-10-05 DIAGNOSIS — E039 Hypothyroidism, unspecified: Secondary | ICD-10-CM | POA: Diagnosis not present

## 2023-10-07 DIAGNOSIS — N1832 Chronic kidney disease, stage 3b: Secondary | ICD-10-CM | POA: Diagnosis not present

## 2023-10-08 DIAGNOSIS — I129 Hypertensive chronic kidney disease with stage 1 through stage 4 chronic kidney disease, or unspecified chronic kidney disease: Secondary | ICD-10-CM | POA: Diagnosis not present

## 2023-10-08 DIAGNOSIS — N2581 Secondary hyperparathyroidism of renal origin: Secondary | ICD-10-CM | POA: Diagnosis not present

## 2023-10-08 DIAGNOSIS — N184 Chronic kidney disease, stage 4 (severe): Secondary | ICD-10-CM | POA: Diagnosis not present

## 2023-10-08 DIAGNOSIS — D631 Anemia in chronic kidney disease: Secondary | ICD-10-CM | POA: Diagnosis not present

## 2023-10-19 ENCOUNTER — Other Ambulatory Visit (HOSPITAL_COMMUNITY): Payer: Self-pay | Admitting: Nephrology

## 2023-10-26 DIAGNOSIS — N1832 Chronic kidney disease, stage 3b: Secondary | ICD-10-CM | POA: Diagnosis not present

## 2023-10-26 DIAGNOSIS — R809 Proteinuria, unspecified: Secondary | ICD-10-CM | POA: Diagnosis not present

## 2023-10-26 DIAGNOSIS — Z23 Encounter for immunization: Secondary | ICD-10-CM | POA: Diagnosis not present

## 2023-10-26 DIAGNOSIS — E1122 Type 2 diabetes mellitus with diabetic chronic kidney disease: Secondary | ICD-10-CM | POA: Diagnosis not present

## 2023-10-26 DIAGNOSIS — I1310 Hypertensive heart and chronic kidney disease without heart failure, with stage 1 through stage 4 chronic kidney disease, or unspecified chronic kidney disease: Secondary | ICD-10-CM | POA: Diagnosis not present

## 2023-10-26 DIAGNOSIS — D638 Anemia in other chronic diseases classified elsewhere: Secondary | ICD-10-CM | POA: Diagnosis not present

## 2023-10-26 DIAGNOSIS — E1165 Type 2 diabetes mellitus with hyperglycemia: Secondary | ICD-10-CM | POA: Diagnosis not present

## 2023-10-26 DIAGNOSIS — E039 Hypothyroidism, unspecified: Secondary | ICD-10-CM | POA: Diagnosis not present

## 2023-10-27 ENCOUNTER — Encounter (HOSPITAL_COMMUNITY)
Admission: RE | Admit: 2023-10-27 | Discharge: 2023-10-27 | Disposition: A | Discharge status: Home / Self Care | Source: Ambulatory Visit | Attending: Nephrology | Admitting: Nephrology

## 2023-10-27 MED ORDER — TECHNETIUM TC 99M SESTAMIBI - CARDIOLITE
25.4000 | Freq: Once | INTRAVENOUS | Status: AC | PRN
Start: 2023-10-27 — End: 2023-10-27
  Administered 2023-10-27: 25.4 via INTRAVENOUS

## 2023-12-21 DIAGNOSIS — K7581 Nonalcoholic steatohepatitis (NASH): Secondary | ICD-10-CM | POA: Diagnosis not present

## 2023-12-21 DIAGNOSIS — K7469 Other cirrhosis of liver: Secondary | ICD-10-CM | POA: Diagnosis not present

## 2023-12-21 DIAGNOSIS — R188 Other ascites: Secondary | ICD-10-CM | POA: Diagnosis not present

## 2023-12-21 DIAGNOSIS — K766 Portal hypertension: Secondary | ICD-10-CM | POA: Diagnosis not present

## 2023-12-21 DIAGNOSIS — I8510 Secondary esophageal varices without bleeding: Secondary | ICD-10-CM | POA: Diagnosis not present
